# Patient Record
Sex: Male | Born: 1955 | Race: Black or African American | Hispanic: No | Marital: Married | Smoking: Former smoker
Health system: Southern US, Community
[De-identification: ages and names within clinical notes are randomized; demographics above are authoritative.]

## PROBLEM LIST (undated history)

## (undated) DIAGNOSIS — Z9114 Patient's other noncompliance with medication regimen: Secondary | ICD-10-CM

## (undated) DIAGNOSIS — E119 Type 2 diabetes mellitus without complications: Secondary | ICD-10-CM

## (undated) DIAGNOSIS — I10 Essential (primary) hypertension: Secondary | ICD-10-CM

## (undated) DIAGNOSIS — G459 Transient cerebral ischemic attack, unspecified: Secondary | ICD-10-CM

## (undated) DIAGNOSIS — Z9111 Patient's noncompliance with dietary regimen: Secondary | ICD-10-CM

## (undated) DIAGNOSIS — N289 Disorder of kidney and ureter, unspecified: Secondary | ICD-10-CM

---

## 2011-07-28 DIAGNOSIS — G459 Transient cerebral ischemic attack, unspecified: Secondary | ICD-10-CM

## 2012-05-12 DIAGNOSIS — G459 Transient cerebral ischemic attack, unspecified: Secondary | ICD-10-CM

## 2012-08-23 ENCOUNTER — Encounter (INDEPENDENT_AMBULATORY_CARE_PROVIDER_SITE_OTHER): Payer: Medicaid - Out of State | Admitting: Ophthalmology

## 2012-08-23 DIAGNOSIS — E1165 Type 2 diabetes mellitus with hyperglycemia: Secondary | ICD-10-CM

## 2012-08-23 DIAGNOSIS — E1139 Type 2 diabetes mellitus with other diabetic ophthalmic complication: Secondary | ICD-10-CM

## 2012-08-23 DIAGNOSIS — H3581 Retinal edema: Secondary | ICD-10-CM

## 2012-08-23 DIAGNOSIS — H43819 Vitreous degeneration, unspecified eye: Secondary | ICD-10-CM

## 2012-08-23 DIAGNOSIS — H35039 Hypertensive retinopathy, unspecified eye: Secondary | ICD-10-CM

## 2012-08-23 DIAGNOSIS — I10 Essential (primary) hypertension: Secondary | ICD-10-CM

## 2012-08-23 DIAGNOSIS — E11319 Type 2 diabetes mellitus with unspecified diabetic retinopathy without macular edema: Secondary | ICD-10-CM

## 2012-09-01 ENCOUNTER — Ambulatory Visit (INDEPENDENT_AMBULATORY_CARE_PROVIDER_SITE_OTHER): Payer: Medicaid - Out of State | Admitting: Ophthalmology

## 2012-09-01 DIAGNOSIS — H3581 Retinal edema: Secondary | ICD-10-CM

## 2012-09-01 DIAGNOSIS — E1139 Type 2 diabetes mellitus with other diabetic ophthalmic complication: Secondary | ICD-10-CM

## 2012-09-01 DIAGNOSIS — E1165 Type 2 diabetes mellitus with hyperglycemia: Secondary | ICD-10-CM

## 2013-01-05 ENCOUNTER — Ambulatory Visit (INDEPENDENT_AMBULATORY_CARE_PROVIDER_SITE_OTHER): Payer: Medicaid - Out of State | Admitting: Ophthalmology

## 2013-02-02 ENCOUNTER — Ambulatory Visit (INDEPENDENT_AMBULATORY_CARE_PROVIDER_SITE_OTHER): Payer: Medicaid - Out of State | Admitting: Ophthalmology

## 2013-02-02 DIAGNOSIS — H43819 Vitreous degeneration, unspecified eye: Secondary | ICD-10-CM

## 2013-02-02 DIAGNOSIS — H2510 Age-related nuclear cataract, unspecified eye: Secondary | ICD-10-CM

## 2013-02-02 DIAGNOSIS — E1139 Type 2 diabetes mellitus with other diabetic ophthalmic complication: Secondary | ICD-10-CM

## 2013-02-02 DIAGNOSIS — H35039 Hypertensive retinopathy, unspecified eye: Secondary | ICD-10-CM

## 2013-02-02 DIAGNOSIS — I10 Essential (primary) hypertension: Secondary | ICD-10-CM

## 2013-02-02 DIAGNOSIS — E11319 Type 2 diabetes mellitus with unspecified diabetic retinopathy without macular edema: Secondary | ICD-10-CM

## 2013-08-03 ENCOUNTER — Ambulatory Visit (INDEPENDENT_AMBULATORY_CARE_PROVIDER_SITE_OTHER): Payer: Self-pay | Admitting: Ophthalmology

## 2017-08-16 ENCOUNTER — Emergency Department (HOSPITAL_COMMUNITY): Payer: BLUE CROSS/BLUE SHIELD

## 2017-08-16 ENCOUNTER — Observation Stay (HOSPITAL_COMMUNITY)
Admission: EM | Admit: 2017-08-16 | Discharge: 2017-08-17 | Disposition: A | Discharge status: Home / Self Care | Payer: BLUE CROSS/BLUE SHIELD | Attending: Family Medicine | Admitting: Family Medicine

## 2017-08-16 ENCOUNTER — Encounter (HOSPITAL_COMMUNITY): Payer: Self-pay | Admitting: *Deleted

## 2017-08-16 DIAGNOSIS — R109 Unspecified abdominal pain: Secondary | ICD-10-CM | POA: Diagnosis not present

## 2017-08-16 DIAGNOSIS — R739 Hyperglycemia, unspecified: Secondary | ICD-10-CM

## 2017-08-16 DIAGNOSIS — I10 Essential (primary) hypertension: Secondary | ICD-10-CM | POA: Diagnosis not present

## 2017-08-16 DIAGNOSIS — R531 Weakness: Secondary | ICD-10-CM | POA: Diagnosis present

## 2017-08-16 DIAGNOSIS — E872 Acidosis: Secondary | ICD-10-CM | POA: Insufficient documentation

## 2017-08-16 DIAGNOSIS — Z794 Long term (current) use of insulin: Secondary | ICD-10-CM | POA: Diagnosis not present

## 2017-08-16 DIAGNOSIS — E119 Type 2 diabetes mellitus without complications: Secondary | ICD-10-CM

## 2017-08-16 DIAGNOSIS — I160 Hypertensive urgency: Secondary | ICD-10-CM

## 2017-08-16 DIAGNOSIS — E1165 Type 2 diabetes mellitus with hyperglycemia: Secondary | ICD-10-CM | POA: Insufficient documentation

## 2017-08-16 DIAGNOSIS — D72829 Elevated white blood cell count, unspecified: Secondary | ICD-10-CM | POA: Insufficient documentation

## 2017-08-16 DIAGNOSIS — R112 Nausea with vomiting, unspecified: Secondary | ICD-10-CM

## 2017-08-16 DIAGNOSIS — Z79899 Other long term (current) drug therapy: Secondary | ICD-10-CM | POA: Insufficient documentation

## 2017-08-16 DIAGNOSIS — F17200 Nicotine dependence, unspecified, uncomplicated: Secondary | ICD-10-CM | POA: Diagnosis not present

## 2017-08-16 DIAGNOSIS — N179 Acute kidney failure, unspecified: Secondary | ICD-10-CM | POA: Diagnosis not present

## 2017-08-16 DIAGNOSIS — K209 Esophagitis, unspecified without bleeding: Secondary | ICD-10-CM | POA: Diagnosis present

## 2017-08-16 HISTORY — DX: Transient cerebral ischemic attack, unspecified: G45.9

## 2017-08-16 HISTORY — DX: Patient's other noncompliance with medication regimen: Z91.14

## 2017-08-16 HISTORY — DX: Essential (primary) hypertension: I10

## 2017-08-16 HISTORY — DX: Patient's noncompliance with dietary regimen: Z91.11

## 2017-08-16 HISTORY — DX: Type 2 diabetes mellitus without complications: E11.9

## 2017-08-16 LAB — COMPREHENSIVE METABOLIC PANEL
ALT: 25 U/L (ref 17–63)
AST: 25 U/L (ref 15–41)
Albumin: 4.6 g/dL (ref 3.5–5.0)
Alkaline Phosphatase: 80 U/L (ref 38–126)
Anion gap: 16 — ABNORMAL HIGH (ref 5–15)
BUN: 48 mg/dL — ABNORMAL HIGH (ref 6–20)
CALCIUM: 9.8 mg/dL (ref 8.9–10.3)
CHLORIDE: 90 mmol/L — AB (ref 101–111)
CO2: 26 mmol/L (ref 22–32)
CREATININE: 2.44 mg/dL — AB (ref 0.61–1.24)
GFR, EST AFRICAN AMERICAN: 32 mL/min — AB (ref >60)
GFR, EST NON AFRICAN AMERICAN: 27 mL/min — AB (ref >60)
Glucose, Bld: 455 mg/dL — ABNORMAL HIGH (ref 65–99)
Potassium: 3.9 mmol/L (ref 3.5–5.1)
Sodium: 132 mmol/L — ABNORMAL LOW (ref 135–145)
Total Bilirubin: 2 mg/dL — ABNORMAL HIGH (ref 0.3–1.2)
Total Protein: 8.4 g/dL — ABNORMAL HIGH (ref 6.5–8.1)

## 2017-08-16 LAB — URINALYSIS, ROUTINE W REFLEX MICROSCOPIC
BILIRUBIN URINE: NEGATIVE
Bacteria, UA: NONE SEEN
Glucose, UA: >=500 mg/dL — AB
Ketones, ur: 5 mg/dL — AB
LEUKOCYTES UA: NEGATIVE
NITRITE: NEGATIVE
Protein, ur: 100 mg/dL — AB
SPECIFIC GRAVITY, URINE: 1.014 (ref 1.005–1.030)
Squamous Epithelial / LPF: NONE SEEN
pH: 9 — ABNORMAL HIGH (ref 5.0–8.0)

## 2017-08-16 LAB — RAPID URINE DRUG SCREEN, HOSP PERFORMED
AMPHETAMINES: NOT DETECTED
BENZODIAZEPINES: NOT DETECTED
Barbiturates: NOT DETECTED
Cocaine: NOT DETECTED
OPIATES: NOT DETECTED
Tetrahydrocannabinol: NOT DETECTED

## 2017-08-16 LAB — CBC WITH DIFFERENTIAL/PLATELET
BAND NEUTROPHILS: 0 %
BASOS PCT: 1 %
Basophils Absolute: 0.1 10*3/uL (ref 0.0–0.1)
Blasts: 0 %
EOS ABS: 0 10*3/uL (ref 0.0–0.7)
Eosinophils Relative: 0 %
HCT: 39.9 % (ref 39.0–52.0)
HEMOGLOBIN: 14.8 g/dL (ref 13.0–17.0)
Lymphocytes Relative: 18 %
Lymphs Abs: 2 10*3/uL (ref 0.7–4.0)
MCH: 29.7 pg (ref 26.0–34.0)
MCHC: 37.1 g/dL — ABNORMAL HIGH (ref 30.0–36.0)
MCV: 80.1 fL (ref 78.0–100.0)
METAMYELOCYTES PCT: 0 %
MONO ABS: 0.6 10*3/uL (ref 0.1–1.0)
MYELOCYTES: 0 %
Monocytes Relative: 5 %
Neutro Abs: 8.3 10*3/uL — ABNORMAL HIGH (ref 1.7–7.7)
Neutrophils Relative %: 76 %
Other: 0 %
PROMYELOCYTES ABS: 0 %
Platelets: 172 10*3/uL (ref 150–400)
RBC: 4.98 MIL/uL (ref 4.22–5.81)
RDW: 11.9 % (ref 11.5–15.5)
WBC: 11 10*3/uL — ABNORMAL HIGH (ref 4.0–10.5)
nRBC: 0 /100 WBC

## 2017-08-16 LAB — LACTIC ACID, PLASMA
LACTIC ACID, VENOUS: 2.3 mmol/L — AB (ref 0.5–1.9)
Lactic Acid, Venous: 2.7 mmol/L (ref 0.5–1.9)

## 2017-08-16 LAB — CBG MONITORING, ED
GLUCOSE-CAPILLARY: 412 mg/dL — AB (ref 65–99)
Glucose-Capillary: 433 mg/dL — ABNORMAL HIGH (ref 65–99)
Glucose-Capillary: 371 mg/dL — ABNORMAL HIGH (ref 65–99)
Glucose-Capillary: 296 mg/dL — ABNORMAL HIGH (ref 65–99)

## 2017-08-16 LAB — TROPONIN I

## 2017-08-16 LAB — LIPASE, BLOOD: LIPASE: 56 U/L — AB (ref 11–51)

## 2017-08-16 MED ORDER — MORPHINE SULFATE (PF) 4 MG/ML IV SOLN
4.0000 mg | INTRAVENOUS | Status: DC | PRN
  Administered 2017-08-16: 4 mg via INTRAVENOUS
  Filled 2017-08-16: qty 1

## 2017-08-16 MED ORDER — PANTOPRAZOLE SODIUM 40 MG IV SOLR
40.0000 mg | Freq: Once | INTRAVENOUS | Status: AC
  Administered 2017-08-16: 40 mg via INTRAVENOUS
  Filled 2017-08-16: qty 40

## 2017-08-16 MED ORDER — SODIUM CHLORIDE 0.9 % IV BOLUS (SEPSIS)
1000.0000 mL | Freq: Once | INTRAVENOUS | Status: AC
  Administered 2017-08-16: 1000 mL via INTRAVENOUS

## 2017-08-16 MED ORDER — METOCLOPRAMIDE HCL 10 MG PO TABS
10.0000 mg | ORAL_TABLET | Freq: Once | ORAL | Status: DC
Start: 2017-08-16 — End: 2017-08-16

## 2017-08-16 MED ORDER — INSULIN ASPART 100 UNIT/ML ~~LOC~~ SOLN
5.0000 [IU] | Freq: Once | SUBCUTANEOUS | Status: AC
  Administered 2017-08-17: 5 [IU] via SUBCUTANEOUS

## 2017-08-16 MED ORDER — LABETALOL HCL 5 MG/ML IV SOLN
20.0000 mg | Freq: Once | INTRAVENOUS | Status: AC
  Administered 2017-08-16: 20 mg via INTRAVENOUS
  Filled 2017-08-16: qty 4

## 2017-08-16 MED ORDER — ONDANSETRON HCL 4 MG/2ML IJ SOLN
4.0000 mg | INTRAMUSCULAR | Status: DC | PRN
  Administered 2017-08-16: 4 mg via INTRAVENOUS
  Filled 2017-08-16 (×2): qty 2

## 2017-08-16 MED ORDER — IOPAMIDOL (ISOVUE-300) INJECTION 61%: 30.0000 mL | Freq: Once | INTRAVENOUS | Status: DC | PRN

## 2017-08-16 MED ORDER — AMLODIPINE BESYLATE 5 MG PO TABS: 10.0000 mg | ORAL_TABLET | Freq: Every day | ORAL | Status: DC

## 2017-08-16 MED ORDER — SODIUM CHLORIDE 0.9 % IV BOLUS (SEPSIS)
1000.0000 mL | Freq: Once | INTRAVENOUS | Status: AC
  Administered 2017-08-17: 1000 mL via INTRAVENOUS

## 2017-08-16 MED ORDER — IOPAMIDOL (ISOVUE-300) INJECTION 61%
INTRAVENOUS | Status: AC
  Filled 2017-08-16: qty 30

## 2017-08-16 MED ORDER — INSULIN ASPART 100 UNIT/ML ~~LOC~~ SOLN
5.0000 [IU] | Freq: Once | SUBCUTANEOUS | Status: AC
  Administered 2017-08-16: 5 [IU] via SUBCUTANEOUS
  Filled 2017-08-16: qty 1

## 2017-08-16 MED ORDER — FAMOTIDINE IN NACL 20-0.9 MG/50ML-% IV SOLN
20.0000 mg | Freq: Once | INTRAVENOUS | Status: AC
  Administered 2017-08-16: 20 mg via INTRAVENOUS
  Filled 2017-08-16: qty 50

## 2017-08-16 MED ORDER — METOCLOPRAMIDE HCL 5 MG/ML IJ SOLN
10.0000 mg | Freq: Once | INTRAMUSCULAR | Status: AC
  Administered 2017-08-16: 10 mg via INTRAVENOUS
  Filled 2017-08-16: qty 2

## 2017-08-16 NOTE — ED Notes (Signed)
In radiology

## 2017-08-16 NOTE — ED Triage Notes (Signed)
Pt c/o nausea, vomiting, weakness since Thursday. Pt reports blood sugar of over 400 this morning.

## 2017-08-16 NOTE — ED Notes (Signed)
POC CBG: 296

## 2017-08-16 NOTE — ED Notes (Signed)
Date and time results received: 08/16/17 11:24 PM   Test: Lactic acid Critical Value: 2.7  Name of Provider Notified: Emokpae  Orders Received? Or Actions Taken?: Notified.

## 2017-08-16 NOTE — ED Notes (Signed)
Pt reports sick with N/V since Tuesday  [place] in [place]  Pt shaking and frail appearing- IV established labs drawn and warm blanket

## 2017-08-16 NOTE — ED Notes (Signed)
Pt vommited large amount greenish yellow fluid.  edp notified

## 2017-08-16 NOTE — H&P (Signed)
History and Physical    Russell Gentry ZOX:[id number] DOB: Mar 30, 1956 DOA: 08/16/2017  PCP: Patient, No Pcp Per   Patient coming from: Home  Chief Complaint: Vomiting, Abdominal pain, hyperglycemia  HPI: Russell Gentry is a 61 y.o. male with medical history significant for hypertension, type 2 diabetes, who presented to the ED with complaints of vomiting and abdominal pain. Vomiting started yesterday, 6 episodes yesterday several today. Abdominal pain started this evening, around his umbilicus, nonradiating. No associated loose stools. Last bowel movement was 2 days ago- normal , no masses she did abdominal distention. Patient denies fever, but endorses chills,  no dysuria or frequency. Patient also presented to the ED with complaints of high blood sugars, 400s. Patient denies NSAID use., or Reflux symptoms. Reports having the same abdominal pain in the past with several episodes of vomiting.  ED Course:   Blood pressure elevated 170s to 226 systolic, temperature 98.2, pulse 104. Blood work revealed sodium low 132, but with elevated blood sugar 455, creatinine elevated at 2.4, BUN 48, bicarbonate normal at 26, anion gap minimally elevated at 16. Lipase unremarkable at 56, troponin <0.06, mild leukocytosis- 11, elevated lactic acid 2.3. UA not suggestive of infection, UDS negative. EKG shows sinus tachycardia. CT abdomen and pelvis without contrast- circumferential distal esophageal wall thickening likely esophagitis, two-view chest x-ray negative. Patient was given labetalol 20 mg IV 1, with improvement in blood pressure, also given 5 units of NovoLog- sugar dropped to 371.   Review of Systems: As per HPI otherwise 10 point review of systems negative.   Past Medical History:  Diagnosis Date  . Diabetes mellitus without complication (HCC)   . Hypertension   . Noncompliance with diet and medication regimen   . TIA (transient ischemic attack)    History reviewed. No pertinent  surgical history.   reports that he has been smoking Cigarettes.  He has been smoking about 0.50 packs per day. He has never used smokeless tobacco. He reports that he does not drink alcohol or use drugs.  No Known Allergies  History reviewed. No pertinent family history.  Prior to Admission medications   Medication Sig Start Date End Date Taking? Authorizing Provider  insulin aspart (NOVOLOG) 100 UNIT/ML FlexPen Inject 10 Units into the skin 3 (three) times daily with meals.  04/27/17  Yes [provider]  Insulin Glargine (LANTUS SOLOSTAR) 100 UNIT/ML Solostar Pen Inject 18 Units into the skin at bedtime.  04/27/17  Yes [provider]  lisinopril-hydrochlorothiazide (PRINZIDE,ZESTORETIC) 10-12.5 MG tablet Take 1 tablet by mouth daily.  08/05/17  Yes [provider]  pravastatin (PRAVACHOL) 20 MG tablet Take 20 mg by mouth daily. 08/05/17  Yes [provider]    Physical Exam: Vitals:   08/16/17 2124 08/16/17 2127 08/16/17 2155 08/16/17 2200  BP: (!) 226/124 (!) 226/124 (!) 174/78 (!) 157/74  Pulse: 95 96 98   Resp: (!) 31 18    Temp:      TempSrc:      SpO2: 100% 99% 100% 98%  Weight:      Height:        Constitutional: NAD, calm, comfortable Vitals:   08/16/17 2124 08/16/17 2127 08/16/17 2155 08/16/17 2200  BP: (!) 226/124 (!) 226/124 (!) 174/78 (!) 157/74  Pulse: 95 96 98   Resp: (!) 31 18    Temp:      TempSrc:      SpO2: 100% 99% 100% 98%  Weight:      Height:  Eyes: PERRL, lids and conjunctivae normal ENMT: Mucous membranes are dry. Posterior pharynx clear of any exudate or lesions.Normal dentition.  Neck: normal, supple, no masses, no thyromegaly Respiratory: clear to auscultation bilaterally, no wheezing, no crackles. Normal respiratory effort. No accessory muscle use.  Cardiovascular: Regular rate and rhythm, no murmurs / rubs / gallops. No extremity edema. 2+ pedal pulses. No carotid bruits.  Abdomen: Not distended no  tenderness, no masses palpated. No hepatosplenomegaly. Bowel sounds positive.  Musculoskeletal: no clubbing / cyanosis. No joint deformity upper and lower extremities. Good ROM, no contractures. Normal muscle tone.  Skin: no rashes, lesions, ulcers. No induration Neurologic: CN 2-12 grossly intact. Sensation intact, DTR normal. Strength 5/5 in all 4.  Psychiatric: Normal judgment and insight. Alert and oriented x 3. Normal mood.   Labs on Admission: I have personally reviewed following labs and imaging studies  CBC:  Recent Labs Lab 08/16/17 1839  WBC 11.0*  NEUTROABS 8.3*  HGB 14.8  HCT 39.9  MCV 80.1  PLT 172   Basic Metabolic Panel:  Recent Labs Lab 08/16/17 1839  NA 132*  K 3.9  CL 90*  CO2 26  GLUCOSE 455*  BUN 48*  CREATININE 2.44*  CALCIUM 9.8   Liver Function Tests:  Recent Labs Lab 08/16/17 1839  AST 25  ALT 25  ALKPHOS 80  BILITOT 2.0*  PROT 8.4*  ALBUMIN 4.6    Recent Labs Lab 08/16/17 1839  LIPASE 56*   Cardiac Enzymes:  Recent Labs Lab 08/16/17 1839  TROPONINI <0.03   CBG:  Recent Labs Lab 08/16/17 1825 08/16/17 2042 08/16/17 2145  GLUCAP 433* 412* 371*   Urine analysis:    Component Value Date/Time   COLORURINE STRAW (A) 08/16/2017 1909   APPEARANCEUR CLEAR 08/16/2017 1909   LABSPEC 1.014 08/16/2017 1909   PHURINE 9.0 (H) 08/16/2017 1909   GLUCOSEU >=500 (A) 08/16/2017 1909   HGBUR SMALL (A) 08/16/2017 1909   BILIRUBINUR NEGATIVE 08/16/2017 1909   KETONESUR 5 (A) 08/16/2017 1909   PROTEINUR 100 (A) 08/16/2017 1909   NITRITE NEGATIVE 08/16/2017 1909   LEUKOCYTESUR NEGATIVE 08/16/2017 1909    Radiological Exams on Admission: Ct Abdomen Pelvis Wo Contrast  Result Date: 08/16/2017 CLINICAL DATA:  61 year old male with abdominal pain, nausea and vomiting for 2 days. EXAM: CT ABDOMEN AND PELVIS WITHOUT CONTRAST TECHNIQUE: Multidetector CT imaging of the abdomen and pelvis was performed following the standard protocol  without IV contrast. COMPARISON:  None. FINDINGS: Please note that parenchymal abnormalities may be missed without intravenous contrast. Lower chest: Circumferential wall thickening of the distal esophagus is noted-likely esophagitis Hepatobiliary: The liver and gallbladder are unremarkable. No biliary dilatation. Pancreas: Unremarkable Spleen: Unremarkable Adrenals/Urinary Tract: The kidneys, adrenal glands and bladder are unremarkable. No hydronephrosis or urinary calculi. Stomach/Bowel: Stomach is within normal limits. Appendix appears normal. No evidence of bowel wall thickening, distention, or inflammatory changes. Vascular/Lymphatic: Aortic atherosclerosis. No enlarged abdominal or pelvic lymph nodes. Reproductive: Mild prostate enlargement noted Other: No ascites, focal collection or pneumoperitoneum. Musculoskeletal: No acute abnormality. Moderate degenerative disc disease at L5-S1 noted. IMPRESSION: 1. Circumferential distal esophageal wall thickening -likely esophagitis. 2.  Aortic Atherosclerosis (ICD10-I70.0). 3. Moderate degenerative disc disease at L5-S1. Electronically Signed   By: Harmon PierJeffrey  Hu M.D.   On: 08/16/2017 21:21   Dg Chest 2 View  Result Date: 08/16/2017 CLINICAL DATA:  Chest pain.  Nausea and vomiting. EXAM: CHEST  2 VIEW COMPARISON:  Radiograph 07/26/2011 FINDINGS: The cardiomediastinal contours are normal. The lungs are clear.  Pulmonary vasculature is normal. No consolidation, pleural effusion, or pneumothorax. No acute osseous abnormalities are seen. IMPRESSION: No acute pulmonary process. Electronically Signed   By: Rubye Oaks M.D.   On: 08/16/2017 20:36    EKG: Sinus tachycardia . No old to compare   Assessment/Plan Principal Problem:   AKI (acute kidney injury) (HCC) Active Problems:   HTN (hypertension)   DM2 (diabetes mellitus, type 2) (HCC)   Hyperglycemia   Esophagitis  Acute kidney injury- creatinine -2.44. Patient gets his care in [place] . Per care  everywhere , last Cr check 04/27/17- 1.8, and 1.16 in 2016. BUN elevated at 48. With lactic acidosis. Most likely prerenal/dehydration, 2/2 hyperglycemia, recurrent vomiting. - Admit Obs, med floor - IV fluids normal saline 100 mL/h for 24 hours - BMP a.m. - Hold home antihypertensive lisinopril HCTZ  Abdominal pain and vomiting, mild leukocytosis-11- possibly viral etiology. CT abdomen and pelvis- esophagitis. - Clear liquid diet - IV Zofran - CBC am  Lactic acidosis- likely from contraction/dehydration. Lactic acid trending up after 2 L normal saline 2.3 >>2.7. - Trend - Hydrate  Esophagitis-  possibly from recurrent vomiting. No history of reflux symptoms. No NSAID history. - Continue IV Protonix given in the ED Q24H  Hypertension- blood pressure up to 226/124 in ED. Was given 20 mg IV labetalol, BP now 118/74. Patient endorses compliance with his medications. Recurrent vomiting possibly contributing to have blood pressure. - Hold home antihypertensive lisinopril HCTZ, with AKI. - We will hold off on antihypertensives for now considering acute drop in blood pressure, consider starting Norvasc a.m.  DM- reports he is a type II. Last hemoglobin A1c per care everywhere- 04/27/17- 11.2. Chronically elevated. Home medications Lantus 18 units daily and NovoLog 10 units 3 times a day. CBG- 455 >> 296 . Reports compliance with  His insulins. - Continue IV Protonix given in the ED Q24H - Continue home Lantus at reduced dose 12u daily, with poor by mouth intake - Sliding scale insulin - repeat Novolog 5u x1   DVT prophylaxis: Lovenox Code Status: Full  Family Communication: None at beside  Disposition Plan: Home  Consults called: None  Admission status: Obs, med floor  Onnie Boer MD [facility] Pager [phone]  If 11PM-7AM, please contact night-coverage [email or web address] Password [email or web address]  08/16/2017, 11:22 PM

## 2017-08-16 NOTE — ED Notes (Signed)
Spouse reports several recent admissions in [place]  Pt drinking contrast with spouse assist

## 2017-08-16 NOTE — ED Notes (Signed)
Pt moved to room #3.

## 2017-08-16 NOTE — ED Notes (Signed)
Diabetic education and questions asnwered  Pt spouse reports that the pt's brother is on dialysis and is an amputee

## 2017-08-16 NOTE — ED Notes (Signed)
Call to lab re lactic acid

## 2017-08-16 NOTE — ED Notes (Signed)
Call to   Pt placement re: hospitalist call

## 2017-08-16 NOTE — ED Provider Notes (Signed)
[facility]ENCY DEPT Provider Note   CSN: [id number] Arrival date & time: 08/16/17  1754     History   Chief Complaint Chief Complaint  Patient presents with  . Weakness  . Hyperglycemia    HPI Russell Gentry is a 61 y.o. male.  HPI  Pt was seen at 1835. Per pt, c/o gradual onset and persistence of constant generalized weakness for the past 3 days. Has been associated with generalized abd "pain," multiple intermittent episodes of N/V, and CBG's "over 400." Denies diarrhea, no fevers, no CP/SOB, no cough, no back pain, no black or blood in stools or emesis.   Past Medical History:  Diagnosis Date  . Diabetes mellitus without complication (HCC)   . Hypertension   . Noncompliance with diet and medication regimen   . TIA (transient ischemic attack)     There are no active problems to display for this patient.   History reviewed. No pertinent surgical history.     Home Medications    Prior to Admission medications   Not on File    Family History No family history on file.  Social History Social History  Substance Use Topics  . Smoking status: Current Every Day Smoker  . Smokeless tobacco: Never Used  . Alcohol use No     Allergies   Patient has no known allergies.   Review of Systems Review of Systems ROS: Statement: All systems negative except as marked or noted in the HPI; Constitutional: Negative for fever and chills. +generalized weakness.; ; Eyes: Negative for eye pain, redness and discharge. ; ; ENMT: Negative for ear pain, hoarseness, nasal congestion, sinus pressure and sore throat. ; ; Cardiovascular: Negative for chest pain, palpitations, diaphoresis, dyspnea and peripheral edema. ; ; Respiratory: Negative for cough, wheezing and stridor. ; ; Gastrointestinal: +N/V, abd pain. Negative for diarrhea, blood in stool, hematemesis, jaundice and rectal bleeding. . ; ; Genitourinary: Negative for dysuria, flank pain and hematuria. ; ; Musculoskeletal:  Negative for back pain and neck pain. Negative for swelling and trauma.; ; Skin: Negative for pruritus, rash, abrasions, blisters, bruising and skin lesion.; ; Neuro: Negative for headache, lightheadedness and neck stiffness. Negative for altered level of consciousness, altered mental status, extremity weakness, paresthesias, involuntary movement, seizure and syncope.       Physical Exam Updated Vital Signs BP (!) 176/105 (BP Location: Right Arm)   Pulse (!) 104   Temp 98.2 F (36.8 C) (Oral)   Resp (!) 21   Ht 6' (1.829 m)   Wt 81.6 kg (180 lb)   SpO2 100%   BMI 24.41 kg/m   Patient Vitals for the past 24 hrs:  BP Temp Temp src Pulse Resp SpO2 Height Weight  08/16/17 2200 (!) 157/74 - - - - 98 % - -  08/16/17 2155 (!) 174/78 - - 98 - 100 % - -  08/16/17 2127 (!) 226/124 - - 96 18 99 % - -  08/16/17 2124 (!) 226/124 - - 95 (!) 31 100 % - -  08/16/17 2047 (!) 223/121 - - 97 16 100 % - -  08/16/17 2036 (!) 223/121 - - 100 - 100 % - -  08/16/17 1930 (!) 217/111 - - 96 (!) 33 100 % - -  08/16/17 1918 - - - (!) 103 - 100 % - -  08/16/17 1910 - (!) 97.3 F (36.3 C) Rectal - - - - -  08/16/17 1903 (!) 196/102 - - 97 19 100 % - -  08/16/17 1825 - - - - - - 6' (1.829 m) 81.6 kg (180 lb)  08/16/17 1824 (!) 176/105 98.2 F (36.8 C) Oral (!) 104 (!) 21 100 % - -     Physical Exam 1840: Physical examination:  Nursing notes reviewed; Vital signs and O2 SAT reviewed;  Constitutional: Well developed, Well nourished, In no acute distress; Head:  Normocephalic, atraumatic; Eyes: EOMI, PERRL, No scleral icterus; ENMT: Mouth and pharynx normal, Mucous membranes dry; Neck: Supple, Full range of motion, No lymphadenopathy; Cardiovascular: Tachycardic rate and rhythm, No gallop; Respiratory: Breath sounds clear & equal bilaterally, No wheezes.  Speaking full sentences with ease, Normal respiratory effort/excursion; Chest: Nontender, Movement normal; Abdomen: Soft, +diffuse tenderness to palp. No  rebound or guarding. Nondistended, Normal bowel sounds; Genitourinary: No CVA tenderness; Extremities: Pulses normal, No tenderness, No edema, No calf edema or asymmetry.; Neuro: AA&Ox3, Major CN grossly intact.  Speech clear. No gross focal motor or sensory deficits in extremities.; Skin: Color normal, Warm, Dry.   ED Treatments / Results  Labs (all labs ordered are listed, but only abnormal results are displayed)   EKG  EKG Interpretation  Date/Time:  Sunday August 16 2017 18:30:04 EDT Ventricular Rate:  103 PR Interval:  136 QRS Duration: 84 QT Interval:  358 QTC Calculation: 468 R Axis:   74 Text Interpretation:  Sinus tachycardia Biatrial enlargement Septal infarct , age undetermined Baseline wander No old tracing to compare Confirmed by Samuel Jester [phone]) on 08/16/2017 6:43:03 PM       Radiology   Procedures Procedures (including critical care time)  Medications Ordered in ED Medications  sodium chloride 0.9 % bolus 1,000 mL (not administered)  ondansetron (ZOFRAN) injection 4 mg (not administered)  morphine 4 MG/ML injection 4 mg (not administered)  famotidine (PEPCID) IVPB 20 mg premix (not administered)     Initial Impression / Assessment and Plan / ED Course  I have reviewed the triage vital signs and the nursing notes.  Pertinent labs & imaging results that were available during my care of the patient were reviewed by me and considered in my medical decision making (see chart for details).  MDM Reviewed: previous chart, nursing note and vitals Interpretation: labs, ECG, x-ray and CT scan Total time providing critical care: 30-74 minutes. This excludes time spent performing separately reportable procedures and services. Consults: admitting MD   CRITICAL CARE Performed by: Laray Anger Total critical care time: 35 minutes Critical care time was exclusive of separately billable procedures and treating other patients. Critical care was  necessary to treat or prevent imminent or life-threatening deterioration. Critical care was time spent personally by me on the following activities: development of treatment plan with patient and/or surrogate as well as nursing, discussions with consultants, evaluation of patient's response to treatment, examination of patient, obtaining history from patient or surrogate, ordering and performing treatments and interventions, ordering and review of laboratory studies, ordering and review of radiographic studies, pulse oximetry and re-evaluation of patient's condition.   Results for orders placed or performed during the hospital encounter of 08/16/17  Urinalysis, Routine w reflex microscopic  Result Value Ref Range   Color, Urine STRAW (A) YELLOW   APPearance CLEAR CLEAR   Specific Gravity, Urine 1.014 1.005 - 1.030   pH 9.0 (H) 5.0 - 8.0   Glucose, UA >=500 (A) NEGATIVE mg/dL   Hgb urine dipstick SMALL (A) NEGATIVE   Bilirubin Urine NEGATIVE NEGATIVE   Ketones, ur 5 (A) NEGATIVE mg/dL   Protein, ur 604 (  A) NEGATIVE mg/dL   Nitrite NEGATIVE NEGATIVE   Leukocytes, UA NEGATIVE NEGATIVE   RBC / HPF 0-5 0 - 5 RBC/hpf   WBC, UA 0-5 0 - 5 WBC/hpf   Bacteria, UA NONE SEEN NONE SEEN   Squamous Epithelial / LPF NONE SEEN NONE SEEN  Troponin I  Result Value Ref Range   Troponin I <0.03 <0.03 ng/mL  Comprehensive metabolic panel  Result Value Ref Range   Sodium 132 (L) 135 - 145 mmol/L   Potassium 3.9 3.5 - 5.1 mmol/L   Chloride 90 (L) 101 - 111 mmol/L   CO2 26 22 - 32 mmol/L   Glucose, Bld 455 (H) 65 - 99 mg/dL   BUN 48 (H) 6 - 20 mg/dL   Creatinine, Ser 1.612.44 (H) 0.61 - 1.24 mg/dL   Calcium 9.8 8.9 - 09.610.3 mg/dL   Total Protein 8.4 (H) 6.5 - 8.1 g/dL   Albumin 4.6 3.5 - 5.0 g/dL   AST 25 15 - 41 U/L   ALT 25 17 - 63 U/L   Alkaline Phosphatase 80 38 - 126 U/L   Total Bilirubin 2.0 (H) 0.3 - 1.2 mg/dL   GFR calc non Af Amer 27 (L) >60 mL/min   GFR calc Af Amer 32 (L) >60 mL/min   Anion  gap 16 (H) 5 - 15  Lipase, blood  Result Value Ref Range   Lipase 56 (H) 11 - 51 U/L  Lactic acid, plasma  Result Value Ref Range   Lactic Acid, Venous 2.3 (HH) 0.5 - 1.9 mmol/L  CBC with Differential  Result Value Ref Range   WBC 11.0 (H) 4.0 - 10.5 K/uL   RBC 4.98 4.22 - 5.81 MIL/uL   Hemoglobin 14.8 13.0 - 17.0 g/dL   HCT 04.539.9 40.939.0 - 81.152.0 %   MCV 80.1 78.0 - 100.0 fL   MCH 29.7 26.0 - 34.0 pg   MCHC 37.1 (H) 30.0 - 36.0 g/dL   RDW 91.411.9 78.211.5 - 95.615.5 %   Platelets 172 150 - 400 K/uL   Neutrophils Relative % 76 %   Lymphocytes Relative 18 %   Monocytes Relative 5 %   Eosinophils Relative 0 %   Basophils Relative 1 %   Band Neutrophils 0 %   Metamyelocytes Relative 0 %   Myelocytes 0 %   Promyelocytes Absolute 0 %   Blasts 0 %   nRBC 0 0 /100 WBC   Other 0 %   Neutro Abs 8.3 (H) 1.7 - 7.7 K/uL   Lymphs Abs 2.0 0.7 - 4.0 K/uL   Monocytes Absolute 0.6 0.1 - 1.0 K/uL   Eosinophils Absolute 0.0 0.0 - 0.7 K/uL   Basophils Absolute 0.1 0.0 - 0.1 K/uL  Urine rapid drug screen (hosp performed)  Result Value Ref Range   Opiates NONE DETECTED NONE DETECTED   Cocaine NONE DETECTED NONE DETECTED   Benzodiazepines NONE DETECTED NONE DETECTED   Amphetamines NONE DETECTED NONE DETECTED   Tetrahydrocannabinol NONE DETECTED NONE DETECTED   Barbiturates NONE DETECTED NONE DETECTED  CBG monitoring, ED  Result Value Ref Range   Glucose-Capillary 433 (H) 65 - 99 mg/dL  CBG monitoring, ED  Result Value Ref Range   Glucose-Capillary 412 (H) 65 - 99 mg/dL    Ct Abdomen Pelvis Wo Contrast Result Date: 08/16/2017 CLINICAL DATA:  61 year old male with abdominal pain, nausea and vomiting for 2 days. EXAM: CT ABDOMEN AND PELVIS WITHOUT CONTRAST TECHNIQUE: Multidetector CT imaging of the abdomen and pelvis was performed  following the standard protocol without IV contrast. COMPARISON:  None. FINDINGS: Please note that parenchymal abnormalities may be missed without intravenous contrast. Lower  chest: Circumferential wall thickening of the distal esophagus is noted-likely esophagitis Hepatobiliary: The liver and gallbladder are unremarkable. No biliary dilatation. Pancreas: Unremarkable Spleen: Unremarkable Adrenals/Urinary Tract: The kidneys, adrenal glands and bladder are unremarkable. No hydronephrosis or urinary calculi. Stomach/Bowel: Stomach is within normal limits. Appendix appears normal. No evidence of bowel wall thickening, distention, or inflammatory changes. Vascular/Lymphatic: Aortic atherosclerosis. No enlarged abdominal or pelvic lymph nodes. Reproductive: Mild prostate enlargement noted Other: No ascites, focal collection or pneumoperitoneum. Musculoskeletal: No acute abnormality. Moderate degenerative disc disease at L5-S1 noted. IMPRESSION: 1. Circumferential distal esophageal wall thickening -likely esophagitis. 2.  Aortic Atherosclerosis (ICD10-I70.0). 3. Moderate degenerative disc disease at L5-S1. Electronically Signed   By: Harmon Pier M.D.   On: 08/16/2017 21:21   Dg Chest 2 View Result Date: 08/16/2017 CLINICAL DATA:  Chest pain.  Nausea and vomiting. EXAM: CHEST  2 VIEW COMPARISON:  Radiograph 07/26/2011 FINDINGS: The cardiomediastinal contours are normal. The lungs are clear. Pulmonary vasculature is normal. No consolidation, pleural effusion, or pneumothorax. No acute osseous abnormalities are seen. IMPRESSION: No acute pulmonary process. Electronically Signed   By: Rubye Oaks M.D.   On: 08/16/2017 20:36   [facility] Labs: 04/27/2017:  BUN 21/Cr 1.8 10/01/2015:  BUN 15/Cr 1.16   2140:   IVF NS boluses given initially for elevated CBG and BUN/Cr from baseline. SQ insulin given for elevated CBG. IV pepcid and protonix given for CT findings of esophagitis.  IV labetalol given for HTN; will re-check BP: pt just vomited again despite IV zofran x2; will dose IV reglan. Dx and testing d/w pt and family.  Questions answered.  Verb understanding, agreeable to  admit.  2210:  Pt's BP trending downward after IV Reglan and IV labetalol.   2230:  T/C to [facility]. Mariea Clonts, case discussed, including:  HPI, pertinent PM/SHx, VS/PE, dx testing, ED course and treatment:  Agreeable to admit.    Final Clinical Impressions(s) / ED Diagnoses   Final diagnoses:  None    New Prescriptions New Prescriptions   No medications on file     Samuel Jester, DO 08/19/17 1946

## 2017-08-17 ENCOUNTER — Encounter (HOSPITAL_COMMUNITY): Payer: Self-pay

## 2017-08-17 DIAGNOSIS — E1122 Type 2 diabetes mellitus with diabetic chronic kidney disease: Secondary | ICD-10-CM

## 2017-08-17 DIAGNOSIS — R112 Nausea with vomiting, unspecified: Secondary | ICD-10-CM

## 2017-08-17 DIAGNOSIS — R739 Hyperglycemia, unspecified: Secondary | ICD-10-CM | POA: Diagnosis not present

## 2017-08-17 DIAGNOSIS — N183 Chronic kidney disease, stage 3 (moderate): Secondary | ICD-10-CM | POA: Diagnosis not present

## 2017-08-17 DIAGNOSIS — N179 Acute kidney failure, unspecified: Secondary | ICD-10-CM

## 2017-08-17 DIAGNOSIS — K209 Esophagitis, unspecified: Secondary | ICD-10-CM

## 2017-08-17 DIAGNOSIS — I10 Essential (primary) hypertension: Secondary | ICD-10-CM | POA: Diagnosis not present

## 2017-08-17 LAB — GLUCOSE, CAPILLARY
GLUCOSE-CAPILLARY: 293 mg/dL — AB (ref 65–99)
GLUCOSE-CAPILLARY: 127 mg/dL — AB (ref 65–99)
Glucose-Capillary: 198 mg/dL — ABNORMAL HIGH (ref 65–99)

## 2017-08-17 LAB — COMPREHENSIVE METABOLIC PANEL
ALBUMIN: 3.5 g/dL (ref 3.5–5.0)
ALK PHOS: 56 U/L (ref 38–126)
ALT: 19 U/L (ref 17–63)
AST: 23 U/L (ref 15–41)
Anion gap: 8 (ref 5–15)
BILIRUBIN TOTAL: 1.4 mg/dL — AB (ref 0.3–1.2)
BUN: 29 mg/dL — ABNORMAL HIGH (ref 6–20)
CALCIUM: 8.2 mg/dL — AB (ref 8.9–10.3)
CO2: 29 mmol/L (ref 22–32)
CREATININE: 1.75 mg/dL — AB (ref 0.61–1.24)
Chloride: 99 mmol/L — ABNORMAL LOW (ref 101–111)
GFR calc Af Amer: 47 mL/min — ABNORMAL LOW (ref >60)
GFR calc non Af Amer: 41 mL/min — ABNORMAL LOW (ref >60)
GLUCOSE: 237 mg/dL — AB (ref 65–99)
Potassium: 3.7 mmol/L (ref 3.5–5.1)
SODIUM: 136 mmol/L (ref 135–145)
TOTAL PROTEIN: 7 g/dL (ref 6.5–8.1)

## 2017-08-17 LAB — CBC
HCT: 33.3 % — ABNORMAL LOW (ref 39.0–52.0)
Hemoglobin: 12 g/dL — ABNORMAL LOW (ref 13.0–17.0)
MCH: 29.3 pg (ref 26.0–34.0)
MCHC: 36 g/dL (ref 30.0–36.0)
MCV: 81.2 fL (ref 78.0–100.0)
PLATELETS: 155 10*3/uL (ref 150–400)
RBC: 4.1 MIL/uL — AB (ref 4.22–5.81)
RDW: 11.9 % (ref 11.5–15.5)
WBC: 9.8 10*3/uL (ref 4.0–10.5)

## 2017-08-17 LAB — BASIC METABOLIC PANEL
Anion gap: 8 (ref 5–15)
BUN: 39 mg/dL — AB (ref 6–20)
CALCIUM: 8.1 mg/dL — AB (ref 8.9–10.3)
CO2: 30 mmol/L (ref 22–32)
CREATININE: 2.18 mg/dL — AB (ref 0.61–1.24)
Chloride: 100 mmol/L — ABNORMAL LOW (ref 101–111)
GFR calc Af Amer: 36 mL/min — ABNORMAL LOW (ref >60)
GFR, EST NON AFRICAN AMERICAN: 31 mL/min — AB (ref >60)
Glucose, Bld: 268 mg/dL — ABNORMAL HIGH (ref 65–99)
POTASSIUM: 3.1 mmol/L — AB (ref 3.5–5.1)
SODIUM: 138 mmol/L (ref 135–145)

## 2017-08-17 LAB — LACTIC ACID, PLASMA
LACTIC ACID, VENOUS: 1.8 mmol/L (ref 0.5–1.9)
Lactic Acid, Venous: 1.2 mmol/L (ref 0.5–1.9)

## 2017-08-17 MED ORDER — HEPARIN SODIUM (PORCINE) 5000 UNIT/ML IJ SOLN
5000.0000 [IU] | Freq: Three times a day (TID) | INTRAMUSCULAR | Status: DC
  Administered 2017-08-17 (×2): 5000 [IU] via SUBCUTANEOUS
  Filled 2017-08-17 (×2): qty 1

## 2017-08-17 MED ORDER — INSULIN ASPART 100 UNIT/ML ~~LOC~~ SOLN
0.0000 [IU] | Freq: Three times a day (TID) | SUBCUTANEOUS | Status: DC
  Administered 2017-08-17: 1 [IU] via SUBCUTANEOUS
  Administered 2017-08-17: 2 [IU] via SUBCUTANEOUS

## 2017-08-17 MED ORDER — PANTOPRAZOLE SODIUM 40 MG PO TBEC: 40.0000 mg | DELAYED_RELEASE_TABLET | Freq: Every day | ORAL | 0 refills | Status: DC

## 2017-08-17 MED ORDER — ONDANSETRON HCL 4 MG/2ML IJ SOLN: 4.0000 mg | Freq: Four times a day (QID) | INTRAMUSCULAR | Status: DC | PRN

## 2017-08-17 MED ORDER — INSULIN ASPART 100 UNIT/ML FLEXPEN: 8.0000 [IU] | PEN_INJECTOR | Freq: Three times a day (TID) | SUBCUTANEOUS | Status: AC

## 2017-08-17 MED ORDER — PANTOPRAZOLE SODIUM 40 MG IV SOLR: 40.0000 mg | INTRAVENOUS | Status: DC

## 2017-08-17 MED ORDER — PRAVASTATIN SODIUM 40 MG PO TABS
20.0000 mg | ORAL_TABLET | Freq: Every day | ORAL | Status: DC
  Administered 2017-08-17: 20 mg via ORAL
  Filled 2017-08-17 (×2): qty 1

## 2017-08-17 MED ORDER — ONDANSETRON HCL 4 MG PO TABS: 4.0000 mg | ORAL_TABLET | Freq: Four times a day (QID) | ORAL | Status: DC | PRN

## 2017-08-17 MED ORDER — POTASSIUM CHLORIDE 20 MEQ PO PACK
40.0000 meq | PACK | Freq: Once | ORAL | Status: AC
  Administered 2017-08-17: 40 meq via ORAL
  Filled 2017-08-17: qty 2

## 2017-08-17 MED ORDER — SODIUM CHLORIDE 0.9 % IV SOLN
INTRAVENOUS | Status: DC
  Administered 2017-08-17 (×2): via INTRAVENOUS

## 2017-08-17 MED ORDER — INSULIN GLARGINE 100 UNIT/ML ~~LOC~~ SOLN
12.0000 [IU] | Freq: Every day | SUBCUTANEOUS | Status: DC
  Administered 2017-08-17: 12 [IU] via SUBCUTANEOUS
  Filled 2017-08-17 (×2): qty 0.12

## 2017-08-17 NOTE — Progress Notes (Signed)
Patient's IV removed.  Site WNL.  AVS reviewed with patient.  Verbalized understanding of discharge instructions, physician follow-up, medications.  Patient transported by wheelchair to main entrance at discharge.  Patient stable at time of discharge.

## 2017-08-17 NOTE — ED Notes (Signed)
Blank note to [place], RCharity fundraiser

## 2017-08-17 NOTE — Discharge Summary (Signed)
Physician Discharge Summary  Russell Gentry ZOX:[id number] DOB: November 17, 1956 DOA: 08/16/2017  PCP: Patient, No Pcp Per  [facility] in [place], he can't remember the name  Admit date: 08/16/2017 Discharge date: 08/17/2017  Recommendations for Outpatient Follow-up:  Follow-up care for suspected chronic kidney disease stage III, diabetes mellitus with poor control, hypertension, possible esophagitis. If symptoms recur, could consider outpatient GI evaluation.   Follow-up Information    [facility]. Schedule an appointment as soon as possible for a visit in 2 week(s).            Discharge Diagnoses:  1. Nausea, vomiting, abdominal pain 2. Acute kidney injury 3. Possible esophagitis 4. Diabetes mellitus type 2 5. Essential hypertension  Discharge Condition: improved Disposition: home  Diet recommendation: heart healthy, diabetic diet  Filed Weights   08/16/17 1825 08/17/17 0057  Weight: 81.6 kg (180 lb) 75.4 kg (166 lb 3.6 oz)    History of present illness:  61 year old man PMH hypertension, diabetes mellitus type 2, presented with numerous episodes of vomiting beginning August 30 after eating a pork chop cooked by his daughter. Associated abdominal pain. Admitted for nausea, vomiting, acute kidney injury, suspect esophagitis, malignant hypertension  Hospital Course:  Patient was given fluids and supportive care with a rapid clinical improvement and resolution of nausea, vomiting and abdominal pain. The following day diet was advanced without complication. Acute kidney injury responded to IV fluids, consistent with suspicion for prerenal azotemia/dehydration complicated by hyperglycemia and vomiting as well as lisinopril and hydrochlorothiazide. CT of the abdomen and pelvis without complicating features. Suspected esophagitis was seen. Minimal lactic acidosis was noted, consistent with history of dehydration. No evidence of sepsis. Hypertension was likely secondary to  symptoms and has decreased appropriately. Diabetes mellitus has been uncontrolled as evidenced by May 2018 hemoglobin A1c of 11.2. Hyperglycemia responded to inpatient treatment. On discharge his creatinine is consistent with last measurement of 1.8 on 04/27/2017.    Today's assessment: S: Feels much better. No nausea or vomiting. Tolerated liquids. No pain. Reports that when he has gotten sick in the past similar to this he has multiple episodes of vomiting. This began after eating a pork chop.  O: Vitals: Afebrile, 98.4, 18, 80, 151/87, 100% on room air   Constitutional. Appears calm, comfortable.  Cardiovascular. Regular rate and rhythm. No murmur, rub or gallop.  Respiratory. Clear to auscultation bilaterally. No wheezes, rales or rhonchi. Normal respiratory effort.  Abdomen. Soft, nontender, nondistended.  Psychiatric. Grossly normal mood and affect. Speech fluent and appropriate.   Blood sugars have improved, less than 300 over the last 12 hours.  Potassium within normal limits. BUN is trending down, 29. Creatinine trending down to 1.75. Total bilirubin trending down, 1.4.  Lactic acid has normalized  Leukocytosis has resolved, remainder CBC unremarkable  CT abdomen and pelvis with a circumferential esophageal wall thickening distally, likely esophagitis. Aortic atherosclerosis.  Chest x-ray no acute disease  EKG showed sinus tachycardia, biatrial enlargement, cannot rule out septal MI, age unknown, no previous tracing available  Discharge Instructions  Discharge Instructions    Activity as tolerated - No restrictions    Complete by:  As directed    Diet - low sodium heart healthy    Complete by:  As directed    Diet Carb Modified    Complete by:  As directed    Discharge instructions    Complete by:  As directed    Call your physician or seek immediate medical attention for vomiting, pain, blood  sugars greater than 400 or less than 60 or worsening of condition.       Allergies as of 08/17/2017   No Known Allergies     Medication List    TAKE these medications   insulin aspart 100 UNIT/ML FlexPen Commonly known as:  NOVOLOG Inject 8 Units into the skin 3 (three) times daily with meals. What changed:  how much to take   LANTUS SOLOSTAR 100 UNIT/ML Solostar Pen Generic drug:  Insulin Glargine Inject 18 Units into the skin at bedtime.   lisinopril-hydrochlorothiazide 10-12.5 MG tablet Commonly known as:  PRINZIDE,ZESTORETIC Take 1 tablet by mouth daily.   pantoprazole 40 MG tablet Commonly known as:  PROTONIX Take 1 tablet (40 mg total) by mouth daily.   pravastatin 20 MG tablet Commonly known as:  PRAVACHOL Take 20 mg by mouth daily.            Discharge Care Instructions        Start     Ordered   08/17/17 0000  insulin aspart (NOVOLOG) 100 UNIT/ML FlexPen  3 times daily with meals     08/17/17 1415   08/17/17 0000  pantoprazole (PROTONIX) 40 MG tablet  Daily     08/17/17 1415   08/17/17 0000  Diet - low sodium heart healthy     08/17/17 1415   08/17/17 0000  Discharge instructions    Comments:  Call your physician or seek immediate medical attention for vomiting, pain, blood sugars greater than 400 or less than 60 or worsening of condition.   08/17/17 1415   08/17/17 0000  Activity as tolerated - No restrictions     08/17/17 1415   08/17/17 0000  Diet Carb Modified     08/17/17 1415     No Known Allergies  The results of significant diagnostics from this hospitalization (including imaging, microbiology, ancillary and laboratory) are listed below for reference.    Significant Diagnostic Studies: Ct Abdomen Pelvis Wo Contrast  Result Date: 08/16/2017 CLINICAL DATA:  61 year old male with abdominal pain, nausea and vomiting for 2 days. EXAM: CT ABDOMEN AND PELVIS WITHOUT CONTRAST TECHNIQUE: Multidetector CT imaging of the abdomen and pelvis was performed following the standard protocol without IV contrast. COMPARISON:   None. FINDINGS: Please note that parenchymal abnormalities may be missed without intravenous contrast. Lower chest: Circumferential wall thickening of the distal esophagus is noted-likely esophagitis Hepatobiliary: The liver and gallbladder are unremarkable. No biliary dilatation. Pancreas: Unremarkable Spleen: Unremarkable Adrenals/Urinary Tract: The kidneys, adrenal glands and bladder are unremarkable. No hydronephrosis or urinary calculi. Stomach/Bowel: Stomach is within normal limits. Appendix appears normal. No evidence of bowel wall thickening, distention, or inflammatory changes. Vascular/Lymphatic: Aortic atherosclerosis. No enlarged abdominal or pelvic lymph nodes. Reproductive: Mild prostate enlargement noted Other: No ascites, focal collection or pneumoperitoneum. Musculoskeletal: No acute abnormality. Moderate degenerative disc disease at L5-S1 noted. IMPRESSION: 1. Circumferential distal esophageal wall thickening -likely esophagitis. 2.  Aortic Atherosclerosis (ICD10-I70.0). 3. Moderate degenerative disc disease at L5-S1. Electronically Signed   By: Harmon PierJeffrey  Hu M.D.   On: 08/16/2017 21:21   Dg Chest 2 View  Result Date: 08/16/2017 CLINICAL DATA:  Chest pain.  Nausea and vomiting. EXAM: CHEST  2 VIEW COMPARISON:  Radiograph 07/26/2011 FINDINGS: The cardiomediastinal contours are normal. The lungs are clear. Pulmonary vasculature is normal. No consolidation, pleural effusion, or pneumothorax. No acute osseous abnormalities are seen. IMPRESSION: No acute pulmonary process. Electronically Signed   By: Rubye OaksMelanie  Ehinger M.D.   On: 08/16/2017 20:36  Labs: Basic Metabolic Panel:  Recent Labs Lab 08/16/17 1839 08/17/17 0224 08/17/17 1157  NA 132* 138 136  K 3.9 3.1* 3.7  CL 90* 100* 99*  CO2 26 30 29   GLUCOSE 455* 268* 237*  BUN 48* 39* 29*  CREATININE 2.44* 2.18* 1.75*  CALCIUM 9.8 8.1* 8.2*   Liver Function Tests:  Recent Labs Lab 08/16/17 1839 08/17/17 1157  AST 25 23  ALT  25 19  ALKPHOS 80 56  BILITOT 2.0* 1.4*  PROT 8.4* 7.0  ALBUMIN 4.6 3.5    Recent Labs Lab 08/16/17 1839  LIPASE 56*   CBC:  Recent Labs Lab 08/16/17 1839 08/17/17 0224  WBC 11.0* 9.8  NEUTROABS 8.3*  --   HGB 14.8 12.0*  HCT 39.9 33.3*  MCV 80.1 81.2  PLT 172 155   Cardiac Enzymes:  Recent Labs Lab 08/16/17 1839  TROPONINI <0.03    CBG:  Recent Labs Lab 08/16/17 2145 08/16/17 2328 08/17/17 0103 08/17/17 0806 08/17/17 1130  GLUCAP 371* 296* 293* 127* 198*    Principal Problem:   AKI (acute kidney injury) (HCC) Active Problems:   HTN (hypertension)   DM2 (diabetes mellitus, type 2) (HCC)   Hyperglycemia   Esophagitis   Time coordinating discharge: 35 minutes  Signed:  Brendia Sacks, MD [facility] 08/17/2017, 2:21 PM

## 2017-08-17 NOTE — Care Management Note (Signed)
Case Management Note  Patient Details  Name: Prescilla SoursWesley Randall Regula MRN: [id number] Date of Birth: 11/03/1956  Subjective/Objective:                  Admitted with AKI. Chart reviewed for CM needs. Pt is from home, ind with ADL's. He has insurance and goes to the [facility] for PCP care.   Action/Plan: Pt will DC home with self care. No CM needs noted.   Expected Discharge Date:     08/17/2017             Expected Discharge Plan:  Home/Self Care  In-House Referral:  NA  Discharge planning Services  CM Consult  Post Acute Care Choice:  NA Choice offered to:  NA  Status of Service:  Completed, signed off  Malcolm MetroChildress, Kaelyn Innocent Demske, RN 08/17/2017, 11:49 AM

## 2017-08-18 LAB — URINE CULTURE: CULTURE: NO GROWTH

## 2017-08-18 LAB — HIV ANTIBODY (ROUTINE TESTING W REFLEX): HIV SCREEN 4TH GENERATION: NONREACTIVE

## 2018-06-22 ENCOUNTER — Emergency Department (HOSPITAL_COMMUNITY): Payer: Medicaid - Out of State

## 2018-06-22 ENCOUNTER — Emergency Department (HOSPITAL_COMMUNITY)
Admission: EM | Admit: 2018-06-22 | Discharge: 2018-06-23 | Disposition: A | Discharge status: Home / Self Care | Payer: Medicaid - Out of State | Attending: Emergency Medicine | Admitting: Emergency Medicine

## 2018-06-22 ENCOUNTER — Encounter (HOSPITAL_COMMUNITY): Payer: Self-pay | Admitting: Emergency Medicine

## 2018-06-22 DIAGNOSIS — E119 Type 2 diabetes mellitus without complications: Secondary | ICD-10-CM | POA: Diagnosis not present

## 2018-06-22 DIAGNOSIS — Z87891 Personal history of nicotine dependence: Secondary | ICD-10-CM | POA: Insufficient documentation

## 2018-06-22 DIAGNOSIS — E876 Hypokalemia: Secondary | ICD-10-CM | POA: Insufficient documentation

## 2018-06-22 DIAGNOSIS — K209 Esophagitis, unspecified without bleeding: Secondary | ICD-10-CM

## 2018-06-22 DIAGNOSIS — R1084 Generalized abdominal pain: Secondary | ICD-10-CM | POA: Insufficient documentation

## 2018-06-22 DIAGNOSIS — I10 Essential (primary) hypertension: Secondary | ICD-10-CM | POA: Diagnosis not present

## 2018-06-22 DIAGNOSIS — Z79899 Other long term (current) drug therapy: Secondary | ICD-10-CM | POA: Insufficient documentation

## 2018-06-22 DIAGNOSIS — Z794 Long term (current) use of insulin: Secondary | ICD-10-CM | POA: Diagnosis not present

## 2018-06-22 DIAGNOSIS — R109 Unspecified abdominal pain: Secondary | ICD-10-CM | POA: Diagnosis present

## 2018-06-22 HISTORY — DX: Disorder of kidney and ureter, unspecified: N28.9

## 2018-06-22 LAB — CBC
HEMATOCRIT: 38.3 % — AB (ref 39.0–52.0)
HEMOGLOBIN: 13.9 g/dL (ref 13.0–17.0)
MCH: 29.5 pg (ref 26.0–34.0)
MCHC: 36.3 g/dL — AB (ref 30.0–36.0)
MCV: 81.3 fL (ref 78.0–100.0)
Platelets: 205 10*3/uL (ref 150–400)
RBC: 4.71 MIL/uL (ref 4.22–5.81)
RDW: 11.9 % (ref 11.5–15.5)
WBC: 15.5 10*3/uL — ABNORMAL HIGH (ref 4.0–10.5)

## 2018-06-22 LAB — COMPREHENSIVE METABOLIC PANEL
ALBUMIN: 3.6 g/dL (ref 3.5–5.0)
ALT: 18 U/L (ref 0–44)
ANION GAP: 10 (ref 5–15)
AST: 23 U/L (ref 15–41)
Alkaline Phosphatase: 70 U/L (ref 38–126)
BUN: 19 mg/dL (ref 6–20)
CHLORIDE: 98 mmol/L (ref 98–111)
CO2: 30 mmol/L (ref 22–32)
Calcium: 9.1 mg/dL (ref 8.9–10.3)
Creatinine, Ser: 2.04 mg/dL — ABNORMAL HIGH (ref 0.61–1.24)
GFR calc Af Amer: 39 mL/min — ABNORMAL LOW (ref >60)
GFR calc non Af Amer: 34 mL/min — ABNORMAL LOW (ref >60)
GLUCOSE: 207 mg/dL — AB (ref 70–99)
Potassium: 2.8 mmol/L — ABNORMAL LOW (ref 3.5–5.1)
SODIUM: 138 mmol/L (ref 135–145)
Total Bilirubin: 1.3 mg/dL — ABNORMAL HIGH (ref 0.3–1.2)
Total Protein: 7.3 g/dL (ref 6.5–8.1)

## 2018-06-22 LAB — LIPASE, BLOOD: LIPASE: 33 U/L (ref 11–51)

## 2018-06-22 MED ORDER — FAMOTIDINE IN NACL 20-0.9 MG/50ML-% IV SOLN
20.0000 mg | Freq: Once | INTRAVENOUS | Status: AC
  Administered 2018-06-22: 20 mg via INTRAVENOUS
  Filled 2018-06-22: qty 50

## 2018-06-22 MED ORDER — MORPHINE SULFATE (PF) 4 MG/ML IV SOLN
4.0000 mg | Freq: Once | INTRAVENOUS | Status: AC
  Administered 2018-06-22: 4 mg via INTRAVENOUS
  Filled 2018-06-22: qty 1

## 2018-06-22 MED ORDER — SODIUM CHLORIDE 0.9 % IV BOLUS (SEPSIS)
1000.0000 mL | Freq: Once | INTRAVENOUS | Status: AC
  Administered 2018-06-22: 1000 mL via INTRAVENOUS

## 2018-06-22 MED ORDER — SODIUM CHLORIDE 0.9 % IV BOLUS (SEPSIS)
1000.0000 mL | Freq: Once | INTRAVENOUS | Status: AC
Start: 2018-06-22 — End: 2018-06-22
  Administered 2018-06-22: 1000 mL via INTRAVENOUS

## 2018-06-22 MED ORDER — HALOPERIDOL LACTATE 5 MG/ML IJ SOLN
2.0000 mg | Freq: Once | INTRAMUSCULAR | Status: AC
  Administered 2018-06-22: 2 mg via INTRAVENOUS
  Filled 2018-06-22: qty 1

## 2018-06-22 MED ORDER — PANTOPRAZOLE SODIUM 40 MG PO TBEC: 40.0000 mg | DELAYED_RELEASE_TABLET | Freq: Every day | ORAL | 0 refills | Status: AC

## 2018-06-22 MED ORDER — ONDANSETRON HCL 4 MG/2ML IJ SOLN
4.0000 mg | Freq: Once | INTRAMUSCULAR | Status: AC
  Administered 2018-06-22: 4 mg via INTRAVENOUS
  Filled 2018-06-22: qty 2

## 2018-06-22 MED ORDER — LISINOPRIL 10 MG PO TABS
10.0000 mg | ORAL_TABLET | Freq: Once | ORAL | Status: AC
Start: 2018-06-22 — End: 2018-06-22
  Administered 2018-06-22: 10 mg via ORAL
  Filled 2018-06-22: qty 1

## 2018-06-22 MED ORDER — POTASSIUM CHLORIDE 10 MEQ/100ML IV SOLN
10.0000 meq | INTRAVENOUS | Status: AC
  Administered 2018-06-22 (×2): 10 meq via INTRAVENOUS
  Filled 2018-06-22 (×2): qty 100

## 2018-06-22 MED ORDER — SODIUM CHLORIDE 0.9 % IV SOLN
1000.0000 mL | INTRAVENOUS | Status: DC
  Administered 2018-06-22: 1000 mL via INTRAVENOUS

## 2018-06-22 NOTE — ED Notes (Signed)
Patient transported to CT 

## 2018-06-22 NOTE — ED Provider Notes (Signed)
[facility] EMERGENCY DEPARTMENT Provider Note   CSN: [id number] Arrival date & time: 06/22/18  1435     History   Chief Complaint Chief Complaint  Patient presents with  . Abdominal Pain    HPI Russell Gentry is a 62 y.o. male.  HPI Patient presented to the emergency room for evaluation of nausea, vomiting and abdominal pain.  Patient has a history of diabetes as well as gastroparesis.  He has had trouble with diffuse abdominal pain and vomiting for the past week.  Patient was recently admitted [facility] for the same thing.  He was released over the weekend.  Patient states his symptoms are persisting.  He is not able to eat or drink.  He continues to vomit.  He feels weak and shaky.  No known fevers.  No abdominal surgeries.  No diarrhea or constipation.  She normally does not take any medications for this.  [place].   Past Medical History:  Diagnosis Date  . Diabetes mellitus without complication (HCC)   . Hypertension   . Noncompliance with diet and medication regimen   . Renal disorder   . TIA (transient ischemic attack)     Patient Active Problem List   Diagnosis Date Noted  . AKI (acute kidney injury) (HCC) 08/16/2017  . HTN (hypertension) 08/16/2017  . DM2 (diabetes mellitus, type 2) (HCC) 08/16/2017  . Hyperglycemia 08/16/2017  . Esophagitis 08/16/2017    History reviewed. No pertinent surgical history.      Home Medications    Prior to Admission medications   Medication Sig Start Date End Date Taking? Authorizing Provider  insulin aspart (NOVOLOG) 100 UNIT/ML FlexPen Inject 8 Units into the skin 3 (three) times daily with meals. 08/17/17   Standley BrookingGoodrich, Daniel P, MD  Insulin Glargine (LANTUS SOLOSTAR) 100 UNIT/ML Solostar Pen Inject 18 Units into the skin at bedtime.  04/27/17   [provider]  lisinopril-hydrochlorothiazide (PRINZIDE,ZESTORETIC) 10-12.5 MG tablet Take 1 tablet by mouth  daily.  08/05/17   [provider]  pantoprazole (PROTONIX) 40 MG tablet Take 1 tablet (40 mg total) by mouth daily. 08/17/17   Standley BrookingGoodrich, Daniel P, MD  pravastatin (PRAVACHOL) 20 MG tablet Take 20 mg by mouth daily. 08/05/17   [provider]    Family History Family History  Problem Relation Age of Onset  . Hypertension Mother   . Hypertension Father     Social History Social History   Tobacco Use  . Smoking status: Former Smoker    Packs/day: 0.50    Types: Cigarettes    Last attempt to quit: 06/22/2017    Years since quitting: 1.0  . Smokeless tobacco: Never Used  Substance Use Topics  . Alcohol use: No  . Drug use: No     Allergies   Patient has no known allergies.   Review of Systems Review of Systems  All other systems reviewed and are negative.    Physical Exam Updated Vital Signs BP 136/81   Pulse 89   Temp 98.2 F (36.8 C) (Temporal)   Resp 16   Ht 1.829 m (6')   Wt 83.9 kg (185 lb)   SpO2 98%   BMI 25.09 kg/m   Physical Exam  Constitutional: He appears well-developed and well-nourished. He appears ill. He appears distressed.  HENT:  Head: Normocephalic and atraumatic.  Right Ear: External ear normal.  Left Ear: External ear normal.  Eyes: Conjunctivae are normal. Right eye exhibits no  discharge. Left eye exhibits no discharge. No scleral icterus.  Neck: Neck supple. No tracheal deviation present.  Cardiovascular: Normal rate, regular rhythm and intact distal pulses.  Pulmonary/Chest: Effort normal and breath sounds normal. No stridor. No respiratory distress. He has no wheezes. He has no rales.  Abdominal: Soft. Bowel sounds are normal. He exhibits no distension. There is generalized tenderness. There is no rebound and no guarding.  Musculoskeletal: He exhibits no edema or tenderness.  Neurological: He is alert. He has normal strength. He displays tremor. No cranial nerve deficit (no facial droop, extraocular movements intact, no  slurred speech) or sensory deficit. He exhibits normal muscle tone. He displays no seizure activity. Coordination normal.  Skin: Skin is warm and dry. No rash noted.  Psychiatric: He has a normal mood and affect.  Nursing note and vitals reviewed.    ED Treatments / Results  Labs (all labs ordered are listed, but only abnormal results are displayed) Labs Reviewed  COMPREHENSIVE METABOLIC PANEL - Abnormal; Notable for the following components:      Result Value   Potassium 2.8 (*)    Glucose, Bld 207 (*)    Creatinine, Ser 2.04 (*)    Total Bilirubin 1.3 (*)    GFR calc non Af Amer 34 (*)    GFR calc Af Amer 39 (*)    All other components within normal limits  CBC - Abnormal; Notable for the following components:   WBC 15.5 (*)    HCT 38.3 (*)    MCHC 36.3 (*)    All other components within normal limits  LIPASE, BLOOD  URINALYSIS, ROUTINE W REFLEX MICROSCOPIC    EKG None  Radiology Dg Abdomen Acute W/chest  Result Date: 06/22/2018 CLINICAL DATA:  Abdominal pain, vomiting and diarrhea for 2 weeks. EXAM: DG ABDOMEN ACUTE W/ 1V CHEST COMPARISON:  CT abdomen and pelvis and PA and lateral chest 08/16/2017. FINDINGS: Single-view of the chest demonstrates clear lungs and normal heart size. No pneumothorax or pleural effusion. No bony abnormality. Two views of the abdomen show no free intraperitoneal air. The bowel gas pattern is normal. No abnormal abdominal calcification. No bony abnormality. IMPRESSION: Negative exam. Electronically Signed   By: Drusilla Kanner M.D.   On: 06/22/2018 20:24    Procedures Procedures (including critical care time)  Medications Ordered in ED Medications  sodium chloride 0.9 % bolus 1,000 mL (0 mLs Intravenous Stopped 06/22/18 2032)    Followed by  sodium chloride 0.9 % bolus 1,000 mL (1,000 mLs Intravenous Not Given 06/22/18 2036)    Followed by  0.9 %  sodium chloride infusion (1,000 mLs Intravenous New Bag/Given 06/22/18 2032)  potassium chloride 10  mEq in 100 mL IVPB (10 mEq Intravenous Not Given 06/22/18 2038)  ondansetron (ZOFRAN) injection 4 mg (4 mg Intravenous Given 06/22/18 1933)  haloperidol lactate (HALDOL) injection 2 mg (2 mg Intravenous Given 06/22/18 1933)  lisinopril (PRINIVIL,ZESTRIL) tablet 10 mg (10 mg Oral Given 06/22/18 2031)  morphine 4 MG/ML injection 4 mg (4 mg Intravenous Given 06/22/18 2102)     Initial Impression / Assessment and Plan / ED Course  I have reviewed the triage vital signs and the nursing notes.  Pertinent labs & imaging results that were available during my care of the patient were reviewed by me and considered in my medical decision making (see chart for details).  Clinical Course as of Jun 22 2199  Tue Jun 22, 2018  1913 Labs notable for hypokalemia.   [JK]  1913 Creatinine is also elevated but compared to previous labs this is not significantly changed.   [JK]  1913 Leukocytosis noted   [JK]  2052 Patient is still having pain.  We will proceed with CT scan to evaluate for acute abnormality   [JK]    Clinical Course User Index [JK] Linwood Dibbles, MD    Patient presented to the emergency room for evaluation of abdominal pain.  According to family members the patient has history of gastroparesis.  Patient was treated with IV pain medications.  He continues to have pain.  He does have leukocytosis.  Urinalysis still pending.  I have ordered a CT scan to evaluate further.  Case will be turned over to Dr. Adriana Simas.  Final Clinical Impressions(s) / ED Diagnoses   Final diagnoses:  Hypokalemia  Generalized abdominal pain      Linwood Dibbles, MD 06/22/18 2201

## 2018-06-22 NOTE — Discharge Instructions (Signed)
CT scan showed evidence of esophagitis which is inflammation of your esophagus or swallowing tube.  Medication for same.  Follow-up with gastroenterologist.  Phone number given.

## 2018-06-22 NOTE — ED Triage Notes (Signed)
Patient complaining of abdominal pain with vomiting x 1 week. States [place] was seen recently [place].

## 2018-06-25 ENCOUNTER — Encounter: Payer: Self-pay | Admitting: Gastroenterology

## 2018-09-20 ENCOUNTER — Telehealth: Payer: Self-pay | Admitting: Gastroenterology

## 2018-09-20 ENCOUNTER — Ambulatory Visit: Payer: BLUE CROSS/BLUE SHIELD | Admitting: Gastroenterology

## 2018-09-20 ENCOUNTER — Encounter: Payer: Self-pay | Admitting: Gastroenterology

## 2018-09-20 NOTE — Telephone Encounter (Signed)
Patient was a no show and letter sent  °

## 2019-09-16 IMAGING — CT CT ABD-PELV W/O CM
2 of 4 series · 17 of 46 positions shown, 19 images · non-contrast
Comparison: 08/16/2017

CLINICAL DATA: Generalized acute abdominal pain with nausea and
vomiting. History of gastric paresis from diabetes.

EXAM:
CT ABDOMEN AND PELVIS WITHOUT CONTRAST
TECHNIQUE: Multidetector CT imaging of the abdomen and pelvis was performed
following the standard protocol without IV contrast.

[Series 2: axial st · axial · 0.66mm/px · z∈[+922,+1332]mm · 14 of 92 slices shown, 16 images]
[im 5/92  soft-tissue]
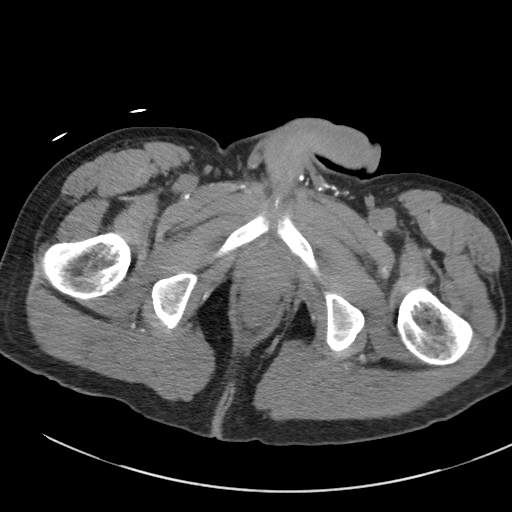
[im 5/92  bone]
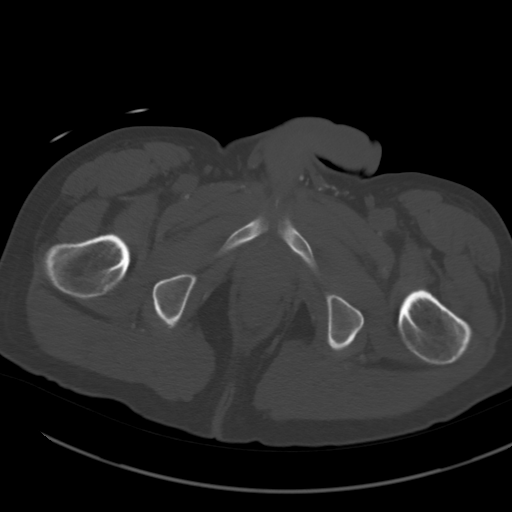
[im 14/92  soft-tissue]
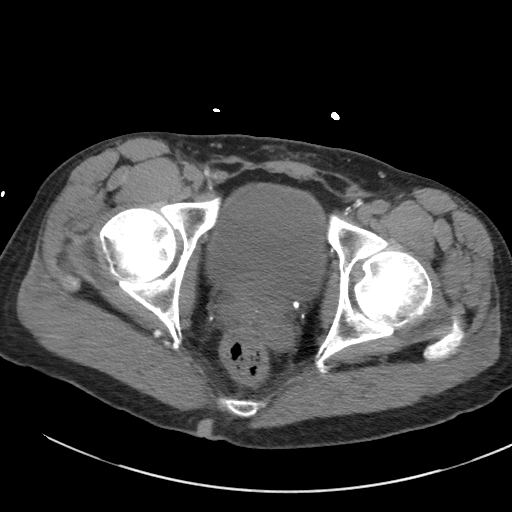
[im 18/92  soft-tissue]
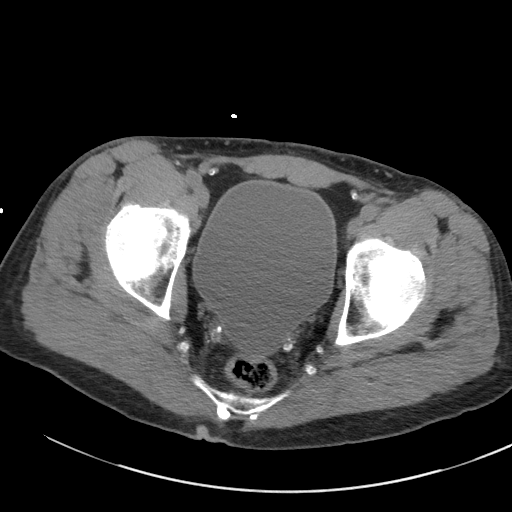
[im 27/92  soft-tissue]
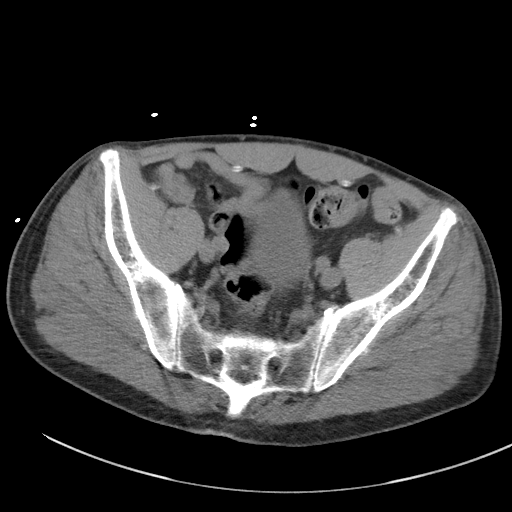
[im 31/92  soft-tissue]
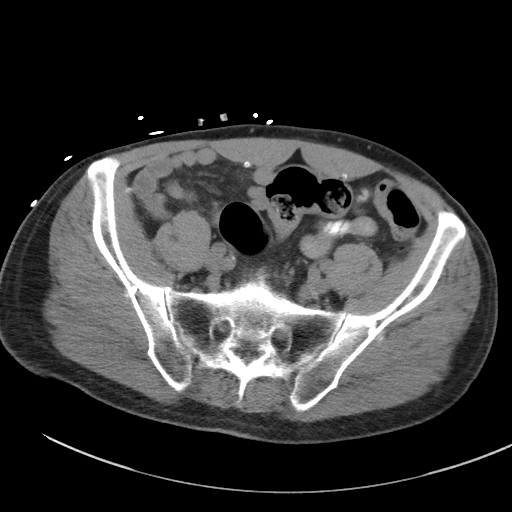
[im 35/92  soft-tissue]
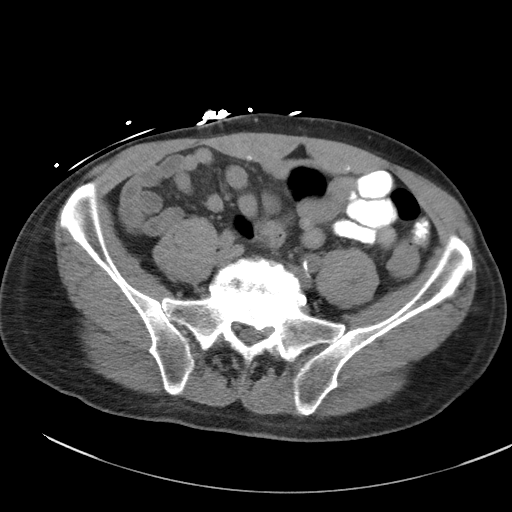
[im 44/92  soft-tissue]
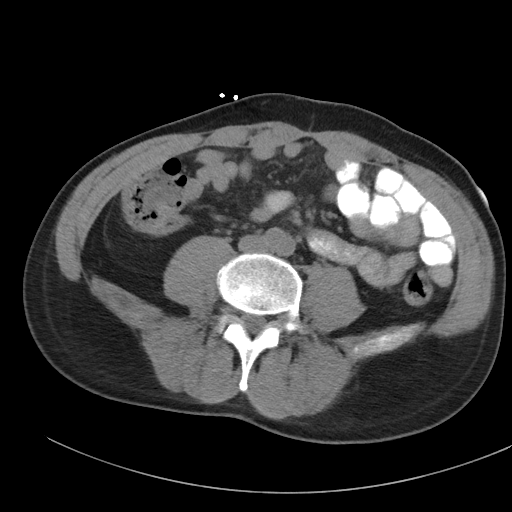
[im 48/92  soft-tissue]
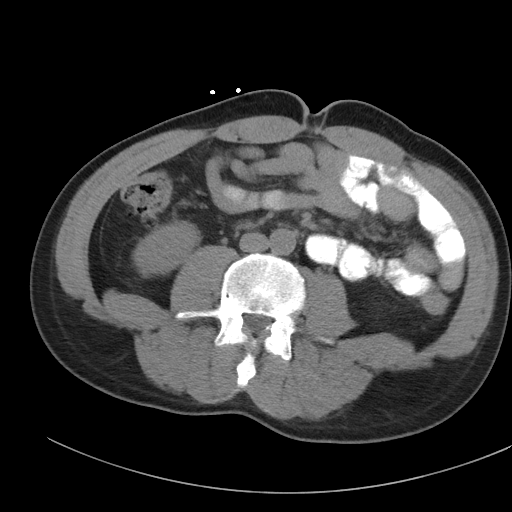
[im 57/92  soft-tissue]
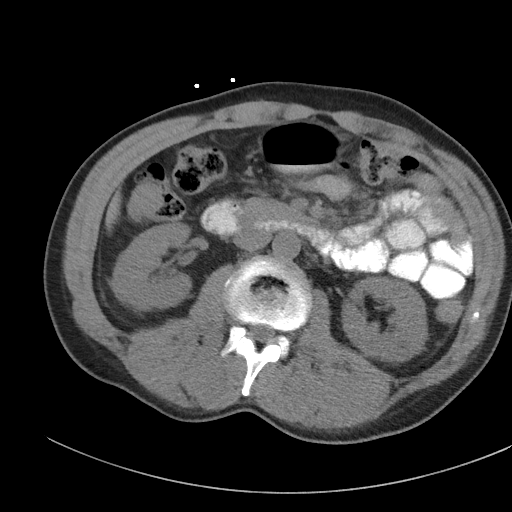
[im 57/92  bone]
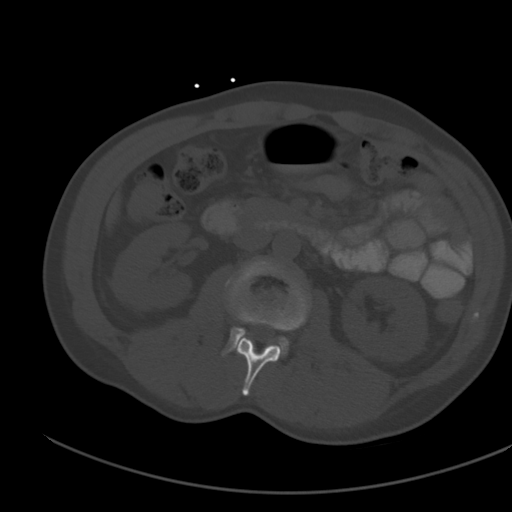
[im 61/92  soft-tissue]
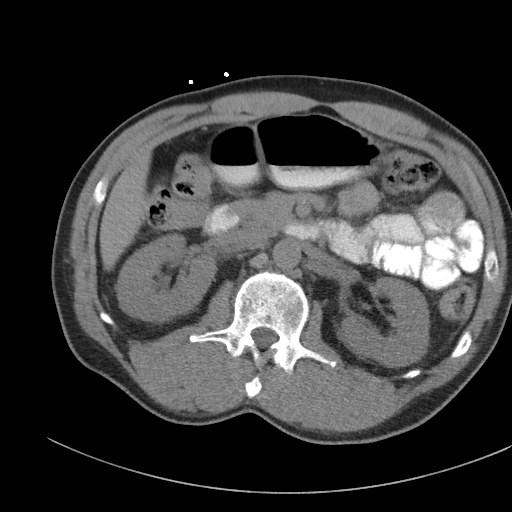
[im 70/92  soft-tissue]
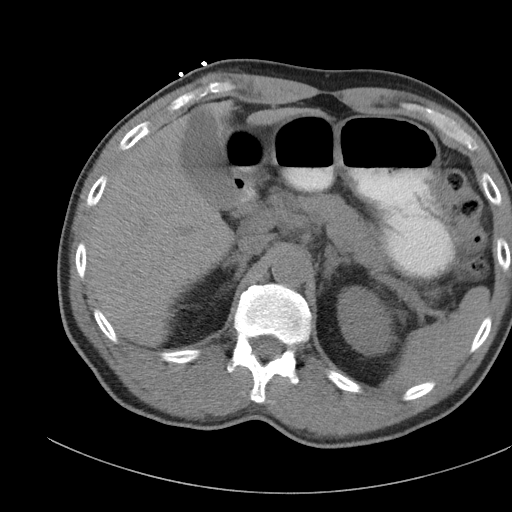
[im 74/92  soft-tissue]
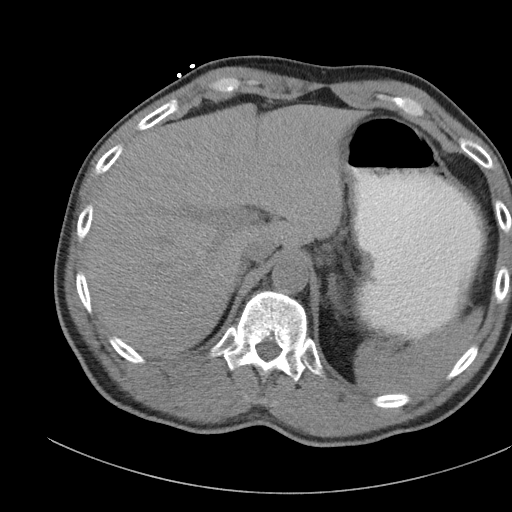
[im 79/92  soft-tissue]
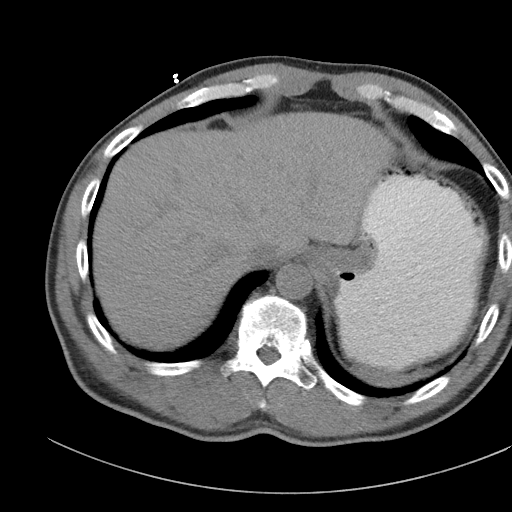
[im 87/92  soft-tissue]
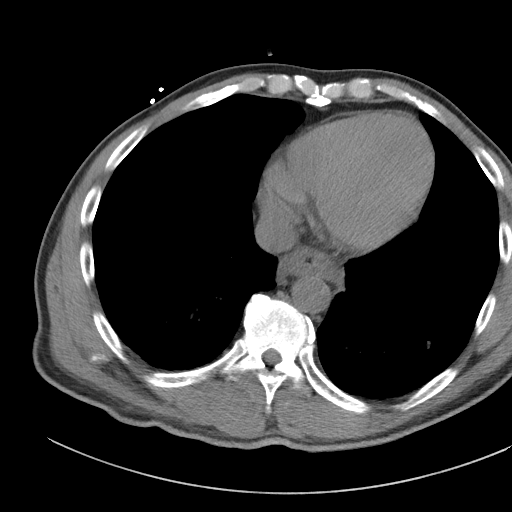

[Series 5: coronal st · coronal · 0.89mm/px · 3 of 84 slices shown]
[im 28/84  soft-tissue]
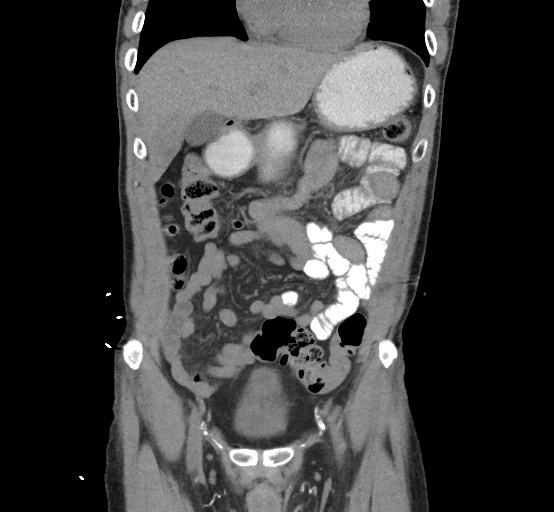
[im 37/84  soft-tissue]
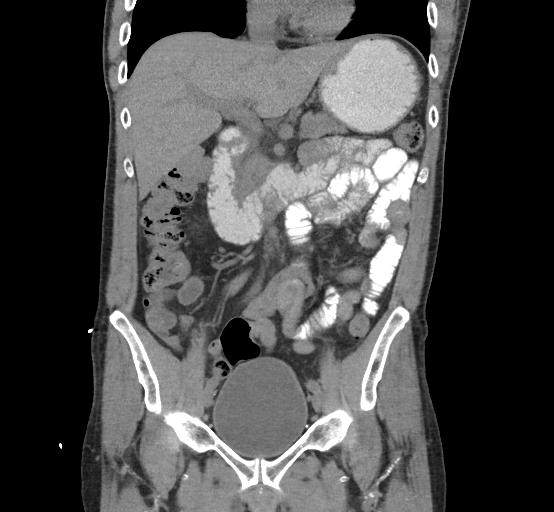
[im 47/84  soft-tissue]
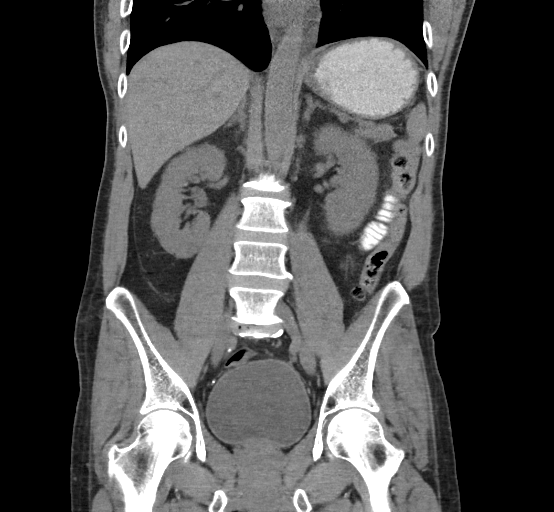

[17 of 46 positions shown; findings below may reference images not displayed]

FINDINGS: Lower chest: Circumferential lower esophageal thickening with
possible mild periesophageal edema. No lower pneumomediastinum or
collection.

Hepatobiliary: No focal liver abnormality.No evidence of biliary
obstruction or stone.

Pancreas: Unremarkable.

Spleen: Unremarkable.

Adrenals/Urinary Tract: Negative adrenals. No hydronephrosis or
stone. Unremarkable bladder.

Stomach/Bowel: Few scattered colonic diverticula. No obstruction or
inflammatory changes in the abdomen. No appendicitis.

Vascular/Lymphatic: Extensive arterial calcification. No mass or
adenopathy.

Reproductive:Vas deferens calcification.

Other: No ascites or pneumoperitoneum.

Musculoskeletal: Degenerative changes without acute finding.
IMPRESSION: 1. Findings of esophagitis.  Recommend follow-up.
2. No acute finding in the abdomen.

## 2019-09-16 IMAGING — DX DG ABDOMEN ACUTE W/ 1V CHEST
4 series · 4 of 4 positions shown · non-contrast
Comparison: CT abdomen and pelvis and PA and lateral chest
08/16/2017.

CLINICAL DATA: Abdominal pain, vomiting and diarrhea for 2 weeks.

EXAM:
DG ABDOMEN ACUTE W/ 1V CHEST

[chest pa]
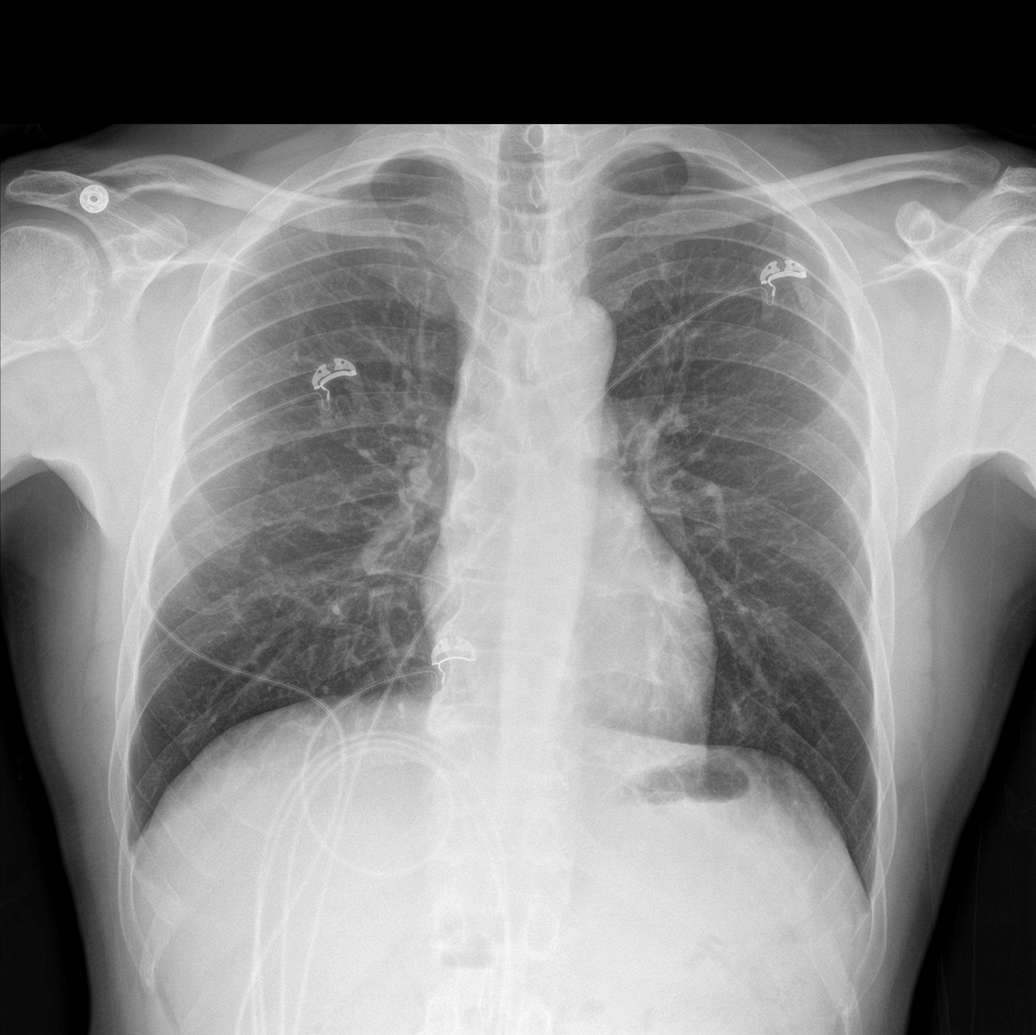

[abdomen erect]
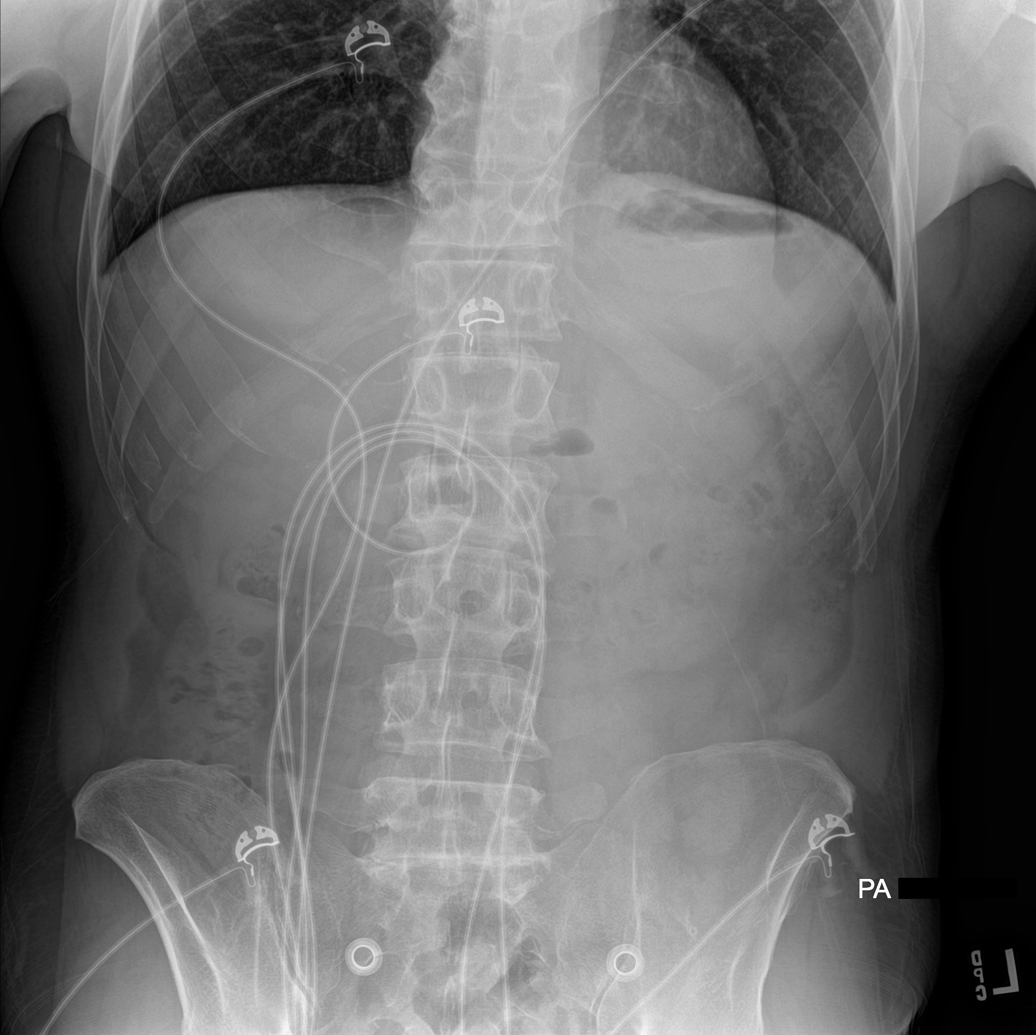

[abdomen supine (1 of 2)]
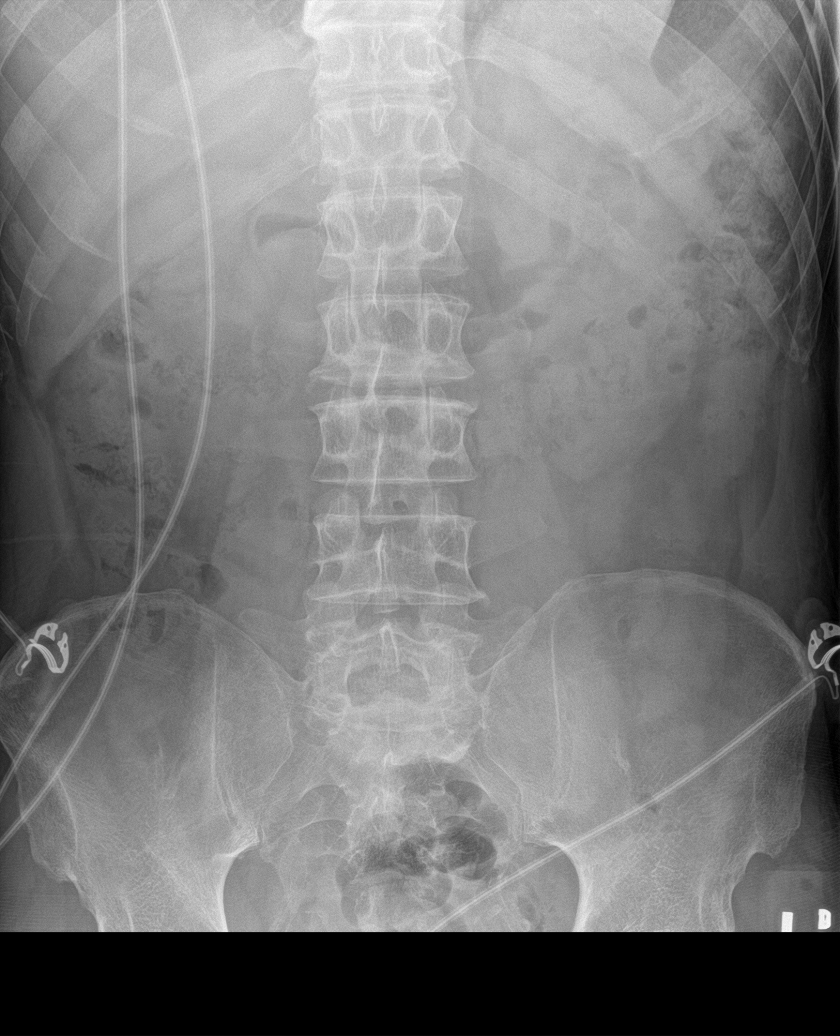

[abdomen supine (2 of 2)]
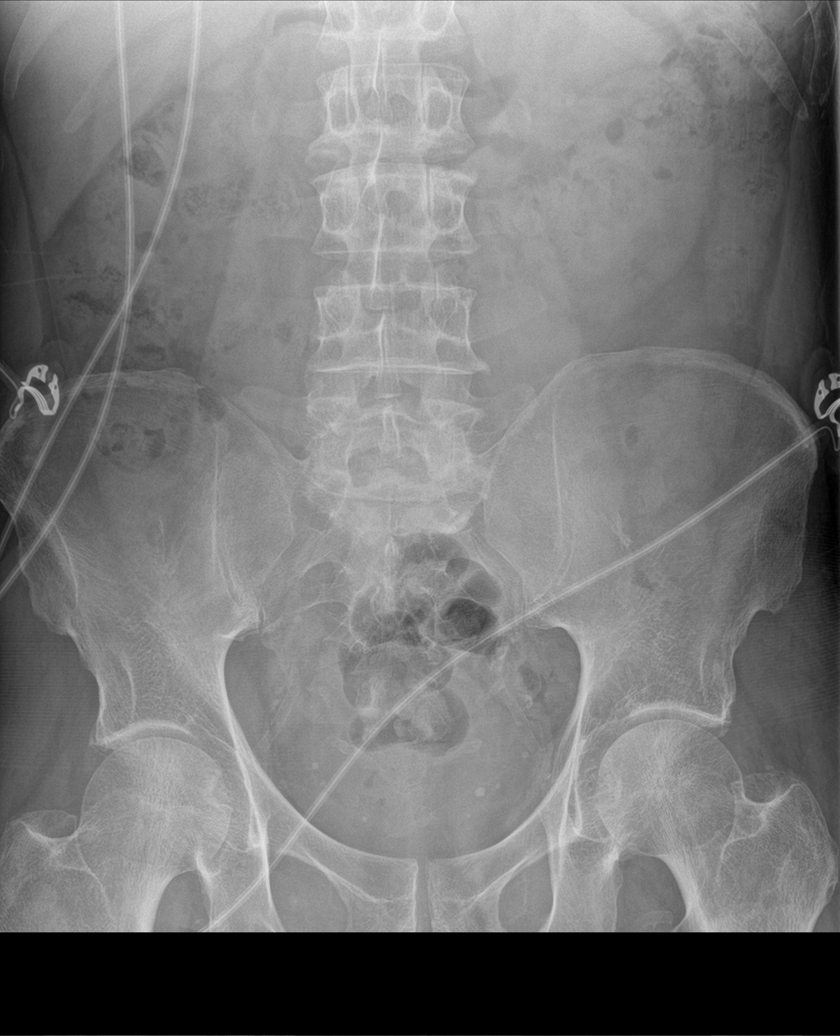

[4 of 4 positions shown; findings below may reference images not displayed]

FINDINGS: Single-view of the chest demonstrates clear lungs and normal heart
size. No pneumothorax or pleural effusion. No bony abnormality.

Two views of the abdomen show no free intraperitoneal air. The bowel
gas pattern is normal. No abnormal abdominal calcification. No bony
abnormality.
IMPRESSION: Negative exam.
